# Patient Record
Sex: Female | Born: 1960 | Race: Black or African American | Hispanic: No | Smoking: Current every day smoker
Health system: Southern US, Community
[De-identification: ages and names within clinical notes are randomized; demographics above are authoritative.]

## PROBLEM LIST (undated history)

## (undated) DIAGNOSIS — F329 Major depressive disorder, single episode, unspecified: Secondary | ICD-10-CM

## (undated) DIAGNOSIS — K219 Gastro-esophageal reflux disease without esophagitis: Secondary | ICD-10-CM

## (undated) DIAGNOSIS — M5030 Other cervical disc degeneration, unspecified cervical region: Secondary | ICD-10-CM

## (undated) DIAGNOSIS — G629 Polyneuropathy, unspecified: Secondary | ICD-10-CM

## (undated) DIAGNOSIS — F32A Depression, unspecified: Secondary | ICD-10-CM

## (undated) DIAGNOSIS — F319 Bipolar disorder, unspecified: Secondary | ICD-10-CM

## (undated) DIAGNOSIS — R350 Frequency of micturition: Secondary | ICD-10-CM

## (undated) DIAGNOSIS — Z9221 Personal history of antineoplastic chemotherapy: Secondary | ICD-10-CM

## (undated) DIAGNOSIS — Z923 Personal history of irradiation: Secondary | ICD-10-CM

## (undated) DIAGNOSIS — C50919 Malignant neoplasm of unspecified site of unspecified female breast: Secondary | ICD-10-CM

## (undated) DIAGNOSIS — N2889 Other specified disorders of kidney and ureter: Secondary | ICD-10-CM

## (undated) DIAGNOSIS — I10 Essential (primary) hypertension: Secondary | ICD-10-CM

## (undated) HISTORY — DX: Bipolar disorder, unspecified: F31.9

## (undated) HISTORY — DX: Essential (primary) hypertension: I10

## (undated) HISTORY — DX: Other cervical disc degeneration, unspecified cervical region: M50.30

## (undated) HISTORY — DX: Malignant neoplasm of unspecified site of unspecified female breast: C50.919

## (undated) HISTORY — DX: Polyneuropathy, unspecified: G62.9

## (undated) HISTORY — PX: WRIST SURGERY: SHX841

## (undated) HISTORY — DX: Depression, unspecified: F32.A

## (undated) HISTORY — DX: Other specified disorders of kidney and ureter: N28.89

## (undated) HISTORY — PX: PARTIAL HYSTERECTOMY: SHX80

## (undated) HISTORY — PX: TOE SURGERY: SHX1073

## (undated) HISTORY — DX: Major depressive disorder, single episode, unspecified: F32.9

## (undated) HISTORY — DX: Frequency of micturition: R35.0

## (undated) HISTORY — PX: COLONOSCOPY: SHX174

---

## 1990-10-17 HISTORY — PX: ABDOMINAL HYSTERECTOMY: SHX81

## 2004-10-17 DIAGNOSIS — C50919 Malignant neoplasm of unspecified site of unspecified female breast: Secondary | ICD-10-CM

## 2004-10-17 HISTORY — PX: BREAST BIOPSY: SHX20

## 2004-10-17 HISTORY — DX: Malignant neoplasm of unspecified site of unspecified female breast: C50.919

## 2004-10-17 HISTORY — PX: BREAST LUMPECTOMY: SHX2

## 2005-08-17 DIAGNOSIS — C50919 Malignant neoplasm of unspecified site of unspecified female breast: Secondary | ICD-10-CM | POA: Insufficient documentation

## 2005-09-07 ENCOUNTER — Ambulatory Visit: Payer: Self-pay | Admitting: Family Medicine

## 2005-09-27 ENCOUNTER — Ambulatory Visit: Payer: Self-pay | Admitting: General Surgery

## 2005-10-12 ENCOUNTER — Ambulatory Visit: Payer: Self-pay | Admitting: Radiation Oncology

## 2005-10-17 ENCOUNTER — Ambulatory Visit: Payer: Self-pay | Admitting: Radiation Oncology

## 2005-10-25 ENCOUNTER — Ambulatory Visit: Payer: Self-pay | Admitting: General Surgery

## 2005-11-17 ENCOUNTER — Ambulatory Visit: Payer: Self-pay | Admitting: Radiation Oncology

## 2005-12-15 ENCOUNTER — Ambulatory Visit: Payer: Self-pay | Admitting: Radiation Oncology

## 2006-01-15 ENCOUNTER — Ambulatory Visit: Payer: Self-pay | Admitting: Radiation Oncology

## 2006-02-14 ENCOUNTER — Ambulatory Visit: Payer: Self-pay | Admitting: Radiation Oncology

## 2006-03-17 ENCOUNTER — Ambulatory Visit: Payer: Self-pay | Admitting: Radiation Oncology

## 2006-04-16 ENCOUNTER — Ambulatory Visit: Payer: Self-pay | Admitting: Radiation Oncology

## 2006-05-17 ENCOUNTER — Ambulatory Visit: Payer: Self-pay | Admitting: Radiation Oncology

## 2006-08-11 ENCOUNTER — Ambulatory Visit: Payer: Self-pay | Admitting: Internal Medicine

## 2006-08-17 ENCOUNTER — Ambulatory Visit: Payer: Self-pay | Admitting: Internal Medicine

## 2006-08-23 ENCOUNTER — Ambulatory Visit: Payer: Self-pay | Admitting: General Surgery

## 2006-11-24 ENCOUNTER — Ambulatory Visit: Payer: Self-pay | Admitting: Internal Medicine

## 2006-12-16 ENCOUNTER — Ambulatory Visit: Payer: Self-pay | Admitting: Internal Medicine

## 2007-02-14 ENCOUNTER — Ambulatory Visit: Payer: Self-pay | Admitting: Radiation Oncology

## 2007-02-15 ENCOUNTER — Ambulatory Visit: Payer: Self-pay | Admitting: Radiation Oncology

## 2007-02-23 ENCOUNTER — Ambulatory Visit: Payer: Self-pay | Admitting: Internal Medicine

## 2007-03-18 ENCOUNTER — Ambulatory Visit: Payer: Self-pay | Admitting: Radiation Oncology

## 2007-03-18 ENCOUNTER — Ambulatory Visit: Payer: Self-pay | Admitting: Internal Medicine

## 2007-06-18 ENCOUNTER — Ambulatory Visit: Payer: Self-pay | Admitting: Internal Medicine

## 2007-06-26 ENCOUNTER — Ambulatory Visit: Payer: Self-pay | Admitting: Internal Medicine

## 2007-06-29 IMAGING — NM NM SENTINAL NODE INJECTION (BREAST) - NO REPORT
1 series · 6 of 6 positions shown · non-contrast
Comparison: none

REASON FOR EXAM: breast CA right surgery at 11am
COMMENTS:

[Series 1: sent node · 2.40mm/px · 3 acquisitions, 6 frames shown]
[im 1/3]
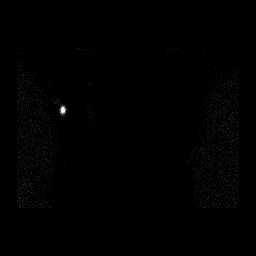
[im 1/3]
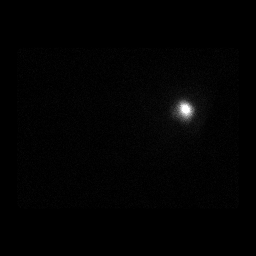
[im 2/3]
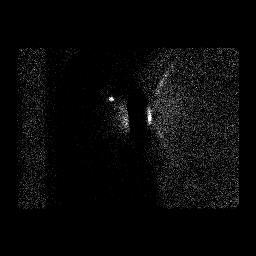
[im 2/3]
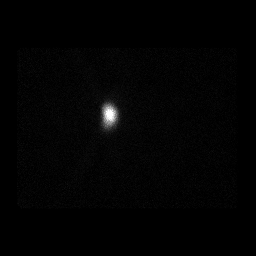
[im 3/3]
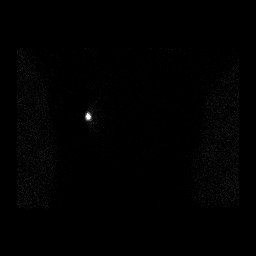
[im 3/3]
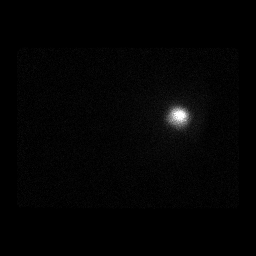

[6 of 6 positions shown; findings below may reference images not displayed]

PROCEDURE:     NM  - NM SENTINEL NODE  BREAST  - September 27, 2005  [DATE]

RESULT:

PROCEDURE AND FINDINGS: Following sterile preparation of the patient, local
anesthesia was administered with 1% lidocaine; 1.15 mCi of technetium-11m
sulfur colloid was administered in the periareolar region for sentinel node
imaging.
IMPRESSION: 1)Successful injection for sentinel node study for RIGHT breast cancer.

## 2007-07-18 ENCOUNTER — Ambulatory Visit: Payer: Self-pay | Admitting: Internal Medicine

## 2007-08-27 ENCOUNTER — Ambulatory Visit: Payer: Self-pay | Admitting: Internal Medicine

## 2007-10-18 ENCOUNTER — Ambulatory Visit: Payer: Self-pay | Admitting: Internal Medicine

## 2007-10-30 ENCOUNTER — Ambulatory Visit: Payer: Self-pay | Admitting: Internal Medicine

## 2007-11-18 ENCOUNTER — Ambulatory Visit: Payer: Self-pay | Admitting: Internal Medicine

## 2007-12-16 ENCOUNTER — Ambulatory Visit: Payer: Self-pay | Admitting: Internal Medicine

## 2008-01-08 ENCOUNTER — Inpatient Hospital Stay: Payer: Self-pay | Admitting: Unknown Physician Specialty

## 2008-02-15 ENCOUNTER — Ambulatory Visit: Payer: Self-pay | Admitting: Internal Medicine

## 2008-02-27 ENCOUNTER — Ambulatory Visit: Payer: Self-pay | Admitting: Internal Medicine

## 2008-03-17 ENCOUNTER — Ambulatory Visit: Payer: Self-pay | Admitting: Internal Medicine

## 2008-06-03 ENCOUNTER — Other Ambulatory Visit: Payer: Self-pay

## 2008-06-03 ENCOUNTER — Inpatient Hospital Stay: Payer: Self-pay | Admitting: Psychiatry

## 2008-06-17 ENCOUNTER — Ambulatory Visit: Payer: Self-pay | Admitting: Internal Medicine

## 2008-06-30 ENCOUNTER — Ambulatory Visit: Payer: Self-pay | Admitting: Internal Medicine

## 2008-07-17 ENCOUNTER — Ambulatory Visit: Payer: Self-pay | Admitting: Internal Medicine

## 2008-09-15 ENCOUNTER — Ambulatory Visit: Payer: Self-pay | Admitting: General Surgery

## 2008-10-17 ENCOUNTER — Ambulatory Visit: Payer: Self-pay | Admitting: Internal Medicine

## 2008-11-05 ENCOUNTER — Ambulatory Visit: Payer: Self-pay | Admitting: Internal Medicine

## 2008-11-17 ENCOUNTER — Ambulatory Visit: Payer: Self-pay | Admitting: Internal Medicine

## 2008-12-15 ENCOUNTER — Ambulatory Visit: Payer: Self-pay | Admitting: Internal Medicine

## 2009-02-14 ENCOUNTER — Ambulatory Visit: Payer: Self-pay | Admitting: Internal Medicine

## 2009-03-10 ENCOUNTER — Ambulatory Visit: Payer: Self-pay | Admitting: Internal Medicine

## 2009-03-10 ENCOUNTER — Encounter: Payer: Self-pay | Admitting: Gastroenterology

## 2009-03-13 ENCOUNTER — Ambulatory Visit: Payer: Self-pay | Admitting: Internal Medicine

## 2009-03-13 ENCOUNTER — Encounter: Payer: Self-pay | Admitting: Gastroenterology

## 2009-03-17 ENCOUNTER — Ambulatory Visit: Payer: Self-pay | Admitting: Internal Medicine

## 2009-03-24 ENCOUNTER — Encounter: Payer: Self-pay | Admitting: Gastroenterology

## 2009-03-26 ENCOUNTER — Encounter: Payer: Self-pay | Admitting: Gastroenterology

## 2009-03-30 ENCOUNTER — Telehealth (INDEPENDENT_AMBULATORY_CARE_PROVIDER_SITE_OTHER): Payer: Self-pay | Admitting: *Deleted

## 2009-03-30 ENCOUNTER — Encounter: Payer: Self-pay | Admitting: Gastroenterology

## 2009-03-30 DIAGNOSIS — R932 Abnormal findings on diagnostic imaging of liver and biliary tract: Secondary | ICD-10-CM

## 2009-03-30 DIAGNOSIS — R634 Abnormal weight loss: Secondary | ICD-10-CM

## 2009-04-02 ENCOUNTER — Ambulatory Visit (HOSPITAL_COMMUNITY): Admission: RE | Admit: 2009-04-02 | Discharge: 2009-04-02 | Payer: Self-pay | Admitting: Gastroenterology

## 2009-04-02 ENCOUNTER — Encounter: Payer: Self-pay | Admitting: Gastroenterology

## 2009-04-02 ENCOUNTER — Ambulatory Visit: Payer: Self-pay | Admitting: Gastroenterology

## 2009-04-16 ENCOUNTER — Ambulatory Visit: Payer: Self-pay | Admitting: Internal Medicine

## 2009-05-17 ENCOUNTER — Ambulatory Visit: Payer: Self-pay | Admitting: Internal Medicine

## 2009-06-01 ENCOUNTER — Ambulatory Visit: Payer: Self-pay | Admitting: Internal Medicine

## 2009-06-17 ENCOUNTER — Ambulatory Visit: Payer: Self-pay | Admitting: Internal Medicine

## 2009-07-17 ENCOUNTER — Ambulatory Visit: Payer: Self-pay | Admitting: Internal Medicine

## 2009-09-16 ENCOUNTER — Ambulatory Visit: Payer: Self-pay | Admitting: Internal Medicine

## 2009-09-28 ENCOUNTER — Ambulatory Visit: Payer: Self-pay | Admitting: Internal Medicine

## 2009-10-17 ENCOUNTER — Ambulatory Visit: Payer: Self-pay | Admitting: Internal Medicine

## 2009-10-17 HISTORY — PX: CERVICAL SPINE SURGERY: SHX589

## 2009-11-17 ENCOUNTER — Ambulatory Visit: Payer: Self-pay | Admitting: Internal Medicine

## 2009-12-09 ENCOUNTER — Ambulatory Visit: Payer: Self-pay | Admitting: Gastroenterology

## 2010-04-16 ENCOUNTER — Ambulatory Visit: Payer: Self-pay | Admitting: Internal Medicine

## 2010-04-20 ENCOUNTER — Ambulatory Visit: Payer: Self-pay | Admitting: Internal Medicine

## 2010-05-17 ENCOUNTER — Ambulatory Visit: Payer: Self-pay | Admitting: Internal Medicine

## 2010-05-27 ENCOUNTER — Encounter: Payer: Self-pay | Admitting: Orthopedic Surgery

## 2010-06-17 ENCOUNTER — Encounter: Payer: Self-pay | Admitting: Orthopedic Surgery

## 2010-07-17 ENCOUNTER — Encounter: Payer: Self-pay | Admitting: Orthopedic Surgery

## 2010-08-17 ENCOUNTER — Encounter: Payer: Self-pay | Admitting: Orthopedic Surgery

## 2010-09-16 ENCOUNTER — Encounter: Payer: Self-pay | Admitting: Orthopedic Surgery

## 2011-08-04 ENCOUNTER — Ambulatory Visit: Payer: Self-pay | Admitting: Family Medicine

## 2012-11-08 ENCOUNTER — Ambulatory Visit: Payer: Self-pay | Admitting: Family Medicine

## 2014-08-29 DIAGNOSIS — M5030 Other cervical disc degeneration, unspecified cervical region: Secondary | ICD-10-CM | POA: Insufficient documentation

## 2014-09-02 ENCOUNTER — Ambulatory Visit: Payer: Self-pay | Admitting: Internal Medicine

## 2014-10-08 ENCOUNTER — Ambulatory Visit: Payer: Self-pay | Admitting: Internal Medicine

## 2014-10-23 DIAGNOSIS — I10 Essential (primary) hypertension: Secondary | ICD-10-CM | POA: Diagnosis not present

## 2014-10-23 DIAGNOSIS — N390 Urinary tract infection, site not specified: Secondary | ICD-10-CM | POA: Diagnosis not present

## 2014-10-23 DIAGNOSIS — N2889 Other specified disorders of kidney and ureter: Secondary | ICD-10-CM | POA: Diagnosis not present

## 2014-10-28 DIAGNOSIS — R350 Frequency of micturition: Secondary | ICD-10-CM | POA: Diagnosis not present

## 2014-10-28 DIAGNOSIS — N2889 Other specified disorders of kidney and ureter: Secondary | ICD-10-CM | POA: Diagnosis not present

## 2014-10-28 DIAGNOSIS — R8290 Unspecified abnormal findings in urine: Secondary | ICD-10-CM | POA: Diagnosis not present

## 2014-10-28 DIAGNOSIS — R319 Hematuria, unspecified: Secondary | ICD-10-CM | POA: Diagnosis not present

## 2014-11-07 ENCOUNTER — Ambulatory Visit: Payer: Self-pay

## 2014-11-07 DIAGNOSIS — N289 Disorder of kidney and ureter, unspecified: Secondary | ICD-10-CM | POA: Diagnosis not present

## 2014-11-07 DIAGNOSIS — N2889 Other specified disorders of kidney and ureter: Secondary | ICD-10-CM | POA: Diagnosis not present

## 2014-11-14 DIAGNOSIS — R8299 Other abnormal findings in urine: Secondary | ICD-10-CM | POA: Diagnosis not present

## 2014-11-14 DIAGNOSIS — N2889 Other specified disorders of kidney and ureter: Secondary | ICD-10-CM | POA: Diagnosis not present

## 2014-11-14 DIAGNOSIS — R197 Diarrhea, unspecified: Secondary | ICD-10-CM | POA: Diagnosis not present

## 2015-04-06 ENCOUNTER — Other Ambulatory Visit: Payer: Self-pay | Admitting: Family Medicine

## 2015-04-06 DIAGNOSIS — N2889 Other specified disorders of kidney and ureter: Secondary | ICD-10-CM

## 2015-05-25 ENCOUNTER — Ambulatory Visit (INDEPENDENT_AMBULATORY_CARE_PROVIDER_SITE_OTHER): Payer: Commercial Managed Care - HMO | Admitting: Urology

## 2015-05-25 VITALS — BP 138/86 | HR 77 | Ht 66.0 in | Wt 148.7 lb

## 2015-05-25 DIAGNOSIS — R310 Gross hematuria: Secondary | ICD-10-CM | POA: Diagnosis not present

## 2015-05-25 DIAGNOSIS — G629 Polyneuropathy, unspecified: Secondary | ICD-10-CM | POA: Insufficient documentation

## 2015-05-25 DIAGNOSIS — N2889 Other specified disorders of kidney and ureter: Secondary | ICD-10-CM

## 2015-05-25 DIAGNOSIS — I10 Essential (primary) hypertension: Secondary | ICD-10-CM | POA: Insufficient documentation

## 2015-05-25 DIAGNOSIS — Z8659 Personal history of other mental and behavioral disorders: Secondary | ICD-10-CM | POA: Insufficient documentation

## 2015-05-25 DIAGNOSIS — R312 Other microscopic hematuria: Secondary | ICD-10-CM | POA: Diagnosis not present

## 2015-05-25 DIAGNOSIS — F32A Depression, unspecified: Secondary | ICD-10-CM | POA: Insufficient documentation

## 2015-05-25 DIAGNOSIS — F329 Major depressive disorder, single episode, unspecified: Secondary | ICD-10-CM | POA: Insufficient documentation

## 2015-05-25 LAB — URINALYSIS, COMPLETE
Bilirubin, UA: NEGATIVE
Glucose, UA: NEGATIVE
Nitrite, UA: NEGATIVE
PH UA: 6 (ref 5.0–7.5)
PROTEIN UA: NEGATIVE
SPEC GRAV UA: 1.025 (ref 1.005–1.030)
UUROB: 1 mg/dL (ref 0.2–1.0)

## 2015-05-25 LAB — MICROSCOPIC EXAMINATION

## 2015-05-25 NOTE — Progress Notes (Signed)
05/25/2015 10:42 AM   Erica Stone 23-Oct-1960 [id number]  Referring provider: Idelle Crouch, MD [place], [place] [address]  Chief Complaint  Patient presents with  . Renal Mass    70month follow up    HPI: Erica Stone returns today in f/u for an 44mm indeterminant lesion seen in the left kidney in 1/16.  She has a new complaint of daily hematuria over the last 4-5 months.   She sees blood on the tissue when she wipes after voiding.  She can see some blood in the toilet as well.  It is occuring primarily early in the month and is generally early in the day.  She is perimenopausal but has not had not a period in 20 years but has had a partial hysterectomy.   She has no dysuria.  She has had some left flank catching that is like a big air bubble but she can get relief with positional changes.  She has not had imaging prior to this visit.      PMH: Past Medical History  Diagnosis Date  . Neuropathy   . Bipolar disorder   . DDD (degenerative disc disease), cervical   . Lobular carcinoma of breast   . Depression   . Urinary frequency   . Renal mass   . Hypertension     Surgical History: Past Surgical History  Procedure Laterality Date  . Partial hysterectomy    . Cervical spine surgery    . Breast lumpectomy      sentinel node biopsy  . Wrist surgery Left     ganglion with reoccurence  . Toe surgery Left     4th & 5th    Home Medications:    Medication List       This list is accurate as of: 05/25/15 10:42 AM.  Always use your most recent med list.               Calcium Citrate-Vitamin D3 1000-400 Liqd  Take by mouth.     cyclobenzaprine 10 MG tablet  Commonly known as:  FLEXERIL  Take by mouth.     D 2000 2000 UNITS Tabs  Generic drug:  Cholecalciferol  Take by mouth.     DAILY COMBO MULTIVITS/CALCIUM PO  Take by mouth.     gabapentin 600 MG tablet  Commonly known as:  NEURONTIN  Take by mouth.     triamterene-hydrochlorothiazide 37.5-25 MG per tablet  Commonly known as:  MAXZIDE-25  TAKE ONE (1) TABLET EACH DAY     vitamin E 1000 UNIT capsule  Take by mouth.        Allergies:  Allergies  Allergen Reactions  . Other     Other reaction(s): Other (See Comments) Gotalonium IV dye- hot with vomiting    Family History: Family History  Problem Relation Age of Onset  . Prostate cancer Father   . Prostate cancer Brother     Social History:  reports that she has been smoking Cigarettes.  She has a 18 pack-year smoking history. She does not have any smokeless tobacco history on file. She reports that she drinks alcohol. She reports that she does not use illicit drugs.  ROS: UROLOGY Frequent Urination?: Yes Hard to postpone urination?: No Burning/pain with urination?: No Get up at night to urinate?: Yes Leakage of urine?: No Urine stream starts and stops?: Yes Trouble starting stream?: Yes Do you have to strain to urinate?: No Blood in urine?: Yes Urinary  tract infection?: No Sexually transmitted disease?: No Injury to kidneys or bladder?: No Painful intercourse?: No Weak stream?: No Currently pregnant?: No Vaginal bleeding?: Yes Last menstrual period?: hysterectomy  Gastrointestinal Nausea?: Yes Vomiting?: No Indigestion/heartburn?: Yes Diarrhea?: Yes Constipation?: No  Constitutional Fever: No Night sweats?: Yes Weight loss?: Yes Fatigue?: Yes  Skin Skin rash/lesions?: No Itching?: No  Eyes Blurred vision?: No Double vision?: No  Ears/Nose/Throat Sore throat?: No Sinus problems?: Yes  Hematologic/Lymphatic Swollen glands?: No Easy bruising?: Yes  Cardiovascular Leg swelling?: No Chest pain?: Yes  Respiratory Cough?: Yes Shortness of breath?: Yes  Endocrine Excessive thirst?: No  Musculoskeletal Back pain?: No Joint pain?: Yes  Neurological Headaches?: Yes Dizziness?: No  Psychologic Depression?: No Anxiety?: No  Physical  Exam: BP 138/86 mmHg  Pulse 77  Ht 5\' 6"  (1.676 m)  Wt 148 lb 11.2 oz (67.45 kg)  BMI 24.01 kg/m2  Physical Exam  Constitutional: She appears well-developed and well-nourished.  Abdominal: Soft. She exhibits no distension (but she is somewhat bloated but soft.) and no mass. There is no tenderness (LUQ ). No hernia.  Genitourinary:  She has some vaginal atrophy with a white discharge.  The meatus is normal.  She has good support.   The bladder is non-tender.  No pelvic masses are noted.   Vitals reviewed.    Laboratory Data: No results found for: WBC, HGB, HCT, MCV, PLT  No results found for: CREATININE  No results found for: PSA  No results found for: TESTOSTERONE  No results found for: HGBA1C  Urinalysis UA has 3-10 WBC, 3-10 RBC and few bacteria.  Pertinent Imaging: Prior MRI report reviewed.  Assessment & Plan:    Problem List Items Addressed This Visit    Left renal mass    She has left flank pain that is probably unrelated to the small mass, but she needs f/u imaging and had intolerance to gadolinium.  I will get a CT for the mass and hematuria.  F/U with results.      Gross hematuria - Primary    She has had intermittent hematuria.  I am going to get a CT AP w/wo and have her return for cystoscopy      Relevant Orders   Urinalysis, Complete   CT Abdomen Pelvis W Wo Contrast   Basic metabolic panel      Return in about 4 weeks (around 06/22/2015) for CT results and cystoscopy.  Erica So, MD  [facility] [address] [place], [place] [address] [phone]

## 2015-05-25 NOTE — Progress Notes (Deleted)
05/25/2015 9:52 AM   [name] [birth year] [id number]  Referring provider: Idelle Crouch, MD [place], [place] [address]  Chief Complaint  Patient presents with  . Renal Mass    46month follow up    HPI: ***     PMH: Past Medical History  Diagnosis Date  . Neuropathy   . Bipolar disorder   . DDD (degenerative disc disease), cervical   . Lobular carcinoma of breast   . Depression   . Urinary frequency   . Renal mass   . Hypertension     Surgical History: Past Surgical History  Procedure Laterality Date  . Partial hysterectomy    . Cervical spine surgery    . Breast lumpectomy      sentinel node biopsy  . Wrist surgery Left     ganglion with reoccurence  . Toe surgery Left     4th & 5th    Home Medications:    Medication List       This list is accurate as of: 05/25/15  9:52 AM.  Always use your most recent med list.               Calcium Citrate-Vitamin D3 1000-400 Liqd  Take by mouth.     cyclobenzaprine 10 MG tablet  Commonly known as:  FLEXERIL  Take by mouth.     D 2000 2000 UNITS Tabs  Generic drug:  Cholecalciferol  Take by mouth.     DAILY COMBO MULTIVITS/CALCIUM PO  Take by mouth.     gabapentin 600 MG tablet  Commonly known as:  NEURONTIN  Take by mouth.     triamterene-hydrochlorothiazide 37.5-25 MG per tablet  Commonly known as:  MAXZIDE-25  TAKE ONE (1) TABLET EACH DAY     vitamin E 1000 UNIT capsule  Take by mouth.        Allergies:  Allergies  Allergen Reactions  . Other     Other reaction(s): Other (See Comments) Gotalonium IV dye- hot with vomiting    Family History: Family History  Problem Relation Age of Onset  . Prostate cancer Father   . Prostate cancer Brother     Social History:  reports that she has been smoking Cigarettes.  She has a 18 pack-year smoking history. She does not have any smokeless tobacco history on file. She reports that she drinks alcohol. She reports that  she does not use illicit drugs.  ROS:                                        Physical Exam: BP 138/86 mmHg  Pulse 77  Ht 5\' 6"  (1.676 m)  Wt 148 lb 11.2 oz (67.45 kg)  BMI 24.01 kg/m2  Constitutional:  Alert and oriented, No acute distress. HEENT:  AT, moist mucus membranes.  Trachea midline, no masses. Cardiovascular: No clubbing, cyanosis, or edema. Respiratory: Normal respiratory effort, no increased work of breathing. GI: Abdomen is soft, nontender, nondistended, no abdominal masses GU: No CVA tenderness. *** Skin: No rashes, bruises or suspicious lesions. Lymph: No cervical or inguinal adenopathy. Neurologic: Grossly intact, no focal deficits, moving all 4 extremities. Psychiatric: Normal mood and affect.  Laboratory Data: No results found for: WBC, HGB, HCT, MCV, PLT  No results found for: CREATININE  No results found for: PSA  No results found for: TESTOSTERONE  No results found  for: HGBA1C  Urinalysis No results found for: COLORURINE, APPEARANCEUR, LABSPEC, PHURINE, GLUCOSEU, HGBUR, BILIRUBINUR, KETONESUR, PROTEINUR, UROBILINOGEN, NITRITE, LEUKOCYTESUR  Pertinent Imaging: ***  Assessment & Plan:  ***  1. Gross hematuria *** - Urinalysis, Complete   No Follow-up on file.  Malka So, MD  [facility] [address] [place], [place] [address] [phone]

## 2015-05-25 NOTE — Assessment & Plan Note (Signed)
She has had intermittent hematuria.  I am going to get a CT AP w/wo and have her return for cystoscopy

## 2015-05-25 NOTE — Assessment & Plan Note (Signed)
She has left flank pain that is probably unrelated to the small mass, but she needs f/u imaging and had intolerance to gadolinium.  I will get a CT for the mass and hematuria.  F/U with results.

## 2015-05-26 LAB — BASIC METABOLIC PANEL
BUN / CREAT RATIO: 13 (ref 9–23)
BUN: 11 mg/dL (ref 6–24)
CHLORIDE: 97 mmol/L (ref 97–108)
CO2: 22 mmol/L (ref 18–29)
Calcium: 9.3 mg/dL (ref 8.7–10.2)
Creatinine, Ser: 0.83 mg/dL (ref 0.57–1.00)
GFR calc Af Amer: 92 mL/min/{1.73_m2} (ref >59)
GFR calc non Af Amer: 80 mL/min/{1.73_m2} (ref >59)
Glucose: 104 mg/dL — ABNORMAL HIGH (ref 65–99)
Potassium: 4 mmol/L (ref 3.5–5.2)
Sodium: 138 mmol/L (ref 134–144)

## 2015-06-02 ENCOUNTER — Ambulatory Visit
Admission: RE | Admit: 2015-06-02 | Discharge: 2015-06-02 | Disposition: A | Discharge status: Home / Self Care | Payer: Commercial Managed Care - HMO | Source: Ambulatory Visit | Attending: Urology | Admitting: Urology

## 2015-06-02 DIAGNOSIS — N2889 Other specified disorders of kidney and ureter: Secondary | ICD-10-CM | POA: Diagnosis not present

## 2015-06-02 DIAGNOSIS — K5730 Diverticulosis of large intestine without perforation or abscess without bleeding: Secondary | ICD-10-CM | POA: Diagnosis not present

## 2015-06-02 DIAGNOSIS — Z853 Personal history of malignant neoplasm of breast: Secondary | ICD-10-CM | POA: Diagnosis not present

## 2015-06-02 DIAGNOSIS — R310 Gross hematuria: Secondary | ICD-10-CM | POA: Insufficient documentation

## 2015-06-02 DIAGNOSIS — N289 Disorder of kidney and ureter, unspecified: Secondary | ICD-10-CM | POA: Diagnosis not present

## 2015-06-02 MED ORDER — IOHEXOL 300 MG/ML  SOLN
125.0000 mL | Freq: Once | INTRAMUSCULAR | Status: AC | PRN
  Administered 2015-06-02: 125 mL via INTRAVENOUS

## 2015-06-17 ENCOUNTER — Other Ambulatory Visit: Payer: Self-pay

## 2015-06-25 ENCOUNTER — Ambulatory Visit (INDEPENDENT_AMBULATORY_CARE_PROVIDER_SITE_OTHER): Payer: Commercial Managed Care - HMO | Admitting: Urology

## 2015-06-25 VITALS — BP 135/77 | HR 102 | Ht 66.0 in | Wt 149.0 lb

## 2015-06-25 DIAGNOSIS — N2889 Other specified disorders of kidney and ureter: Secondary | ICD-10-CM | POA: Diagnosis not present

## 2015-06-25 DIAGNOSIS — R310 Gross hematuria: Secondary | ICD-10-CM

## 2015-06-25 LAB — URINALYSIS, COMPLETE
BILIRUBIN UA: NEGATIVE
GLUCOSE, UA: NEGATIVE
KETONES UA: NEGATIVE
Nitrite, UA: NEGATIVE
Protein, UA: NEGATIVE
SPEC GRAV UA: 1.02 (ref 1.005–1.030)
Urobilinogen, Ur: 0.2 mg/dL (ref 0.2–1.0)
pH, UA: 6.5 (ref 5.0–7.5)

## 2015-06-25 LAB — MICROSCOPIC EXAMINATION
BACTERIA UA: NONE SEEN
WBC UA: NONE SEEN /HPF (ref 0–?)

## 2015-06-25 MED ORDER — CIPROFLOXACIN HCL 500 MG PO TABS
500.0000 mg | ORAL_TABLET | Freq: Once | ORAL | Status: AC
  Administered 2015-06-25: 500 mg via ORAL

## 2015-06-25 MED ORDER — LIDOCAINE HCL 2 % EX GEL
1.0000 "application " | Freq: Once | CUTANEOUS | Status: AC
  Administered 2015-06-25: 1 via URETHRAL

## 2015-06-25 NOTE — Progress Notes (Signed)
    CC: Hematuria, Renal mass  HPI: Erica Stone returns today for follow-up for hematuria. She has a history of an 11 mm indeterminant lesion seen in the left kidney in January 2016. Repeat imaging from August 2016 shows no change in this indeterminant 11 mm left renal lesion. Her CT scan is urogram was otherwise unremarkable. She presents today for completion of her hematuria workup with cystoscopy.   Cystoscopy Procedure Note  Patient identification was confirmed, informed consent was obtained, and patient was prepped using Betadine solution.  Lidocaine jelly was administered per urethral meatus.    Preoperative abx where received prior to procedure.    Procedure: - Flexible cystoscope introduced, without any difficulty.   - Thorough search of the bladder revealed:    normal urethral meatus    normal urothelium    no stones    no ulcers     no tumors    no urethral polyps    no trabeculation  - Ureteral orifices were normal in position and appearance.  Post-Procedure: - Patient tolerated the procedure well    A/P: 1. Hematuria Cystoscopy normal. Hematuria workup negative. Repeat urinalysis in one year.  2.  Left renal mass The patient has a 11 mm left indeterminant renal mass seen on imaging in January and then August 2016 with no interval change. She does not tolerate gadolinium contrast for an MRI. As the mass is stable, we will continue surveillance CT scan at six months.  If it remains stable that time, we can increase the interval between CT scans.  Follow up: 6 months

## 2015-11-23 DIAGNOSIS — I10 Essential (primary) hypertension: Secondary | ICD-10-CM | POA: Diagnosis not present

## 2015-11-23 DIAGNOSIS — Z1329 Encounter for screening for other suspected endocrine disorder: Secondary | ICD-10-CM | POA: Diagnosis not present

## 2015-11-23 DIAGNOSIS — Z79899 Other long term (current) drug therapy: Secondary | ICD-10-CM | POA: Diagnosis not present

## 2015-11-23 DIAGNOSIS — H539 Unspecified visual disturbance: Secondary | ICD-10-CM | POA: Diagnosis not present

## 2015-11-23 DIAGNOSIS — G629 Polyneuropathy, unspecified: Secondary | ICD-10-CM | POA: Diagnosis not present

## 2015-11-23 DIAGNOSIS — R7309 Other abnormal glucose: Secondary | ICD-10-CM | POA: Diagnosis not present

## 2015-11-23 DIAGNOSIS — F3341 Major depressive disorder, recurrent, in partial remission: Secondary | ICD-10-CM | POA: Diagnosis not present

## 2015-11-23 DIAGNOSIS — Z1239 Encounter for other screening for malignant neoplasm of breast: Secondary | ICD-10-CM | POA: Diagnosis not present

## 2015-11-23 DIAGNOSIS — M5030 Other cervical disc degeneration, unspecified cervical region: Secondary | ICD-10-CM | POA: Diagnosis not present

## 2015-11-24 ENCOUNTER — Encounter: Payer: Self-pay | Admitting: Obstetrics and Gynecology

## 2015-11-24 ENCOUNTER — Ambulatory Visit (INDEPENDENT_AMBULATORY_CARE_PROVIDER_SITE_OTHER): Payer: Commercial Managed Care - HMO | Admitting: Obstetrics and Gynecology

## 2015-11-24 VITALS — BP 132/82 | HR 86 | Ht 66.0 in | Wt 154.8 lb

## 2015-11-24 DIAGNOSIS — N939 Abnormal uterine and vaginal bleeding, unspecified: Secondary | ICD-10-CM | POA: Diagnosis not present

## 2015-11-24 DIAGNOSIS — N952 Postmenopausal atrophic vaginitis: Secondary | ICD-10-CM

## 2015-11-24 DIAGNOSIS — R319 Hematuria, unspecified: Secondary | ICD-10-CM

## 2015-11-24 DIAGNOSIS — Z90710 Acquired absence of both cervix and uterus: Secondary | ICD-10-CM | POA: Diagnosis not present

## 2015-11-24 DIAGNOSIS — N898 Other specified noninflammatory disorders of vagina: Secondary | ICD-10-CM

## 2015-11-24 DIAGNOSIS — N950 Postmenopausal bleeding: Secondary | ICD-10-CM | POA: Diagnosis not present

## 2015-11-24 DIAGNOSIS — N761 Subacute and chronic vaginitis: Secondary | ICD-10-CM | POA: Diagnosis not present

## 2015-11-24 DIAGNOSIS — B977 Papillomavirus as the cause of diseases classified elsewhere: Secondary | ICD-10-CM | POA: Diagnosis not present

## 2015-11-24 LAB — POCT URINALYSIS DIPSTICK
BILIRUBIN UA: NEGATIVE
GLUCOSE UA: NEGATIVE
Ketones, UA: NEGATIVE
Leukocytes, UA: NEGATIVE
Nitrite, UA: NEGATIVE
Protein, UA: NEGATIVE
SPEC GRAV UA: 1.01
Urobilinogen, UA: 0.2
pH, UA: 7.5

## 2015-11-24 MED ORDER — CLONIDINE HCL 0.1 MG/24HR TD PTWK: 0.1000 mg | MEDICATED_PATCH | TRANSDERMAL | Status: DC

## 2015-11-24 NOTE — Patient Instructions (Addendum)
1.  Vaginal biopsies are done today. 2.  Urinary catheterization is done today.  Urine is sent for culture. 3.  Return in 10 days for follow-up 4.  Applied the clonidine patch Once a week to help with hot flashes.

## 2015-11-25 LAB — URINE CULTURE: Organism ID, Bacteria: NO GROWTH

## 2015-11-26 LAB — PATHOLOGY

## 2015-11-30 DIAGNOSIS — Z90710 Acquired absence of both cervix and uterus: Secondary | ICD-10-CM | POA: Insufficient documentation

## 2015-11-30 DIAGNOSIS — N939 Abnormal uterine and vaginal bleeding, unspecified: Secondary | ICD-10-CM | POA: Insufficient documentation

## 2015-11-30 DIAGNOSIS — N952 Postmenopausal atrophic vaginitis: Secondary | ICD-10-CM | POA: Insufficient documentation

## 2015-11-30 NOTE — Progress Notes (Signed)
Chief complaint: 1.  Vaginal bleeding.  Patient is a 55 year old after married female, status post hysterectomy for uterine fibroids in 1992, menopausal, with vasomotor symptoms, not on hormone replacement therapy, with history of breast cancer, status post lumpectomy, chemotherapy 10 years ago, currently NED, Presents for evaluation of intermittent vaginal bleeding. Patient reports the bleeding to occur irregularly.  It appears to be coming from the urethra. Patient did have urology workup for dysuria, including cystoscopy.  2 months ago which was negative for abnormality.  CT scan ruled out significant upper tract disease. There is family history of colon cancer in maternal grandmother.  There is family history of prostate cancer in father, as well as renal disease.  Past Medical History  Diagnosis Date  . Neuropathy ([place])   . Bipolar disorder ([place])   . DDD (degenerative disc disease), cervical   . Depression   . Urinary frequency   . Renal mass   . Hypertension   . Lobular carcinoma of breast ([place])     lumpectomy    Past Surgical History  Procedure Laterality Date  . Partial hysterectomy    . Cervical spine surgery    . Breast lumpectomy      sentinel node biopsy  . Wrist surgery Left     ganglion with reoccurence  . Toe surgery Left     4th & 5th  . Abdominal hysterectomy  1992    fibroid- [facility]    Review of systems: Per HPI.  OBJECTIVE: BP 132/82 mmHg  Pulse 86  Ht 5\' 6"  (1.676 m)  Wt 154 lb 12.8 oz (70.217 kg)  BMI 25.00 kg/m2 Pleasant, elderly, African-American female in no acute distress. Back: Without CVA tenderness. Abdomen: Soft, nontender, without guarding; no organomegaly. Pelvic: External genitalia-atrophic changes. BUS-normal. Vagina-moderate to severe atrophy; vaginal mucosa at apex with erythematous blebs in mucosa of unclear etiology; no epithelial ulceration. Cervix-surgically absent Uterus-surgically absent. Adnexa-no palpable  masses. Rectovaginal-normal.  External exam.  PROCEDURE: Vaginal biopsy 3 Tischler biopsy forceps is used to take 3 separate biopsies of vaginal mucosa; Monsel solution is applied for hemostasis.  ASSESSMENT: 1.  History of possible vaginal bleeding, unclear etiology. 2.  History of recent urology workup including negative cystoscopy. 3.  History of breast cancer, status post lumpectomy and chemotherapy. 4.  Atrophy of the vagina with atypical erythematous blebs noted on mucosa, unclear etiology.  PLAN: 1.  Vaginal biopsy 3. 2.  Return in 1 week for follow-up and further management planning  Brayton Mars, MD  Note: This dictation was prepared with Dragon dictation along with smaller phrase technology. Any transcriptional errors that result from this process are unintentional.

## 2015-12-03 ENCOUNTER — Ambulatory Visit (INDEPENDENT_AMBULATORY_CARE_PROVIDER_SITE_OTHER): Payer: Commercial Managed Care - HMO | Admitting: Obstetrics and Gynecology

## 2015-12-03 ENCOUNTER — Encounter: Payer: Self-pay | Admitting: Obstetrics and Gynecology

## 2015-12-03 VITALS — BP 130/75 | HR 83 | Ht 66.0 in | Wt 157.5 lb

## 2015-12-03 DIAGNOSIS — N939 Abnormal uterine and vaginal bleeding, unspecified: Secondary | ICD-10-CM | POA: Diagnosis not present

## 2015-12-03 DIAGNOSIS — R319 Hematuria, unspecified: Secondary | ICD-10-CM | POA: Diagnosis not present

## 2015-12-03 DIAGNOSIS — N898 Other specified noninflammatory disorders of vagina: Secondary | ICD-10-CM

## 2015-12-03 DIAGNOSIS — N952 Postmenopausal atrophic vaginitis: Secondary | ICD-10-CM | POA: Diagnosis not present

## 2015-12-03 LAB — POCT URINALYSIS DIPSTICK
BILIRUBIN UA: NEGATIVE
GLUCOSE UA: NEGATIVE
Ketones, UA: NEGATIVE
Leukocytes, UA: NEGATIVE
Nitrite, UA: NEGATIVE
Protein, UA: NEGATIVE
SPEC GRAV UA: 1.015
UROBILINOGEN UA: 0.2
pH, UA: 6.5

## 2015-12-03 MED ORDER — AZITHROMYCIN 1 G PO PACK: 1.0000 g | PACK | Freq: Once | ORAL | Status: DC

## 2015-12-03 NOTE — Progress Notes (Signed)
Chief complaint: 1. Vaginal bleeding  Patient presents for follow-up on vaginal biopsies done for evaluation of vaginal bleeding. Patient is status post hysterectomy. She has history of breast cancer. She has had a urology workup for hematuria which was negative for abnormalities on cystoscopy or CT scan.  Vaginal biopsy 3 last week was notable for acute and chronic vaginitis with some koilocytosis present without dysplasia.  Patient states that she still is experiencing some faint bleeding on wiping her perineum.  OBJECTIVE: BP 130/75 mmHg  Pulse 83  Ht 5\' 6"  (1.676 m)  Wt 157 lb 8 oz (71.442 kg)  BMI 25.43 kg/m2  Urinalysis notable for blood.  ASSESSMENT: 1. Vaginal bleeding 2. Biopsies demonstrate acute and chronic vaginitis without evidence of cancer, or precancer. Koilocytosis (HPV effect is present)  3. Vaginal atrophy  . PLAN: 1. Zithromax 1 g orally (empiric treatment for acute and chronic vaginitis) 2. Return in 3 months for follow-up and Pap smear 3. Use vaginal moisturizers twice a week (samples of Luvena are given) 4.  Nu swab GC/CT/Trich/Yeast/BV 5.  Urine culture 6. Keep urology appointment as scheduled  A total of 15 minutes were spent face-to-face with the patient during this encounter and over half of that time dealt with counseling and coordination of care.  Brayton Mars, MD  Note: This dictation was prepared with Dragon dictation along with smaller phrase technology. Any transcriptional errors that result from this process are unintentional.

## 2015-12-03 NOTE — Patient Instructions (Addendum)
1. Results from biopsies did NOT demonstrate cancer, precancer. 2. Zithromax 1 g by mouth for acute and chronic vaginitis 3. Return in 3 months for follow-up on vaginal bleeding and Pap smear 4. Use vaginal moisturizer twice a week-Luvena or Replens 5. Urine culture sent to rule out bladder infection 6. Vaginal swab to rule out etiology vaginitis sent

## 2015-12-03 NOTE — Addendum Note (Signed)
Addended by: Kerry Hough on: 12/03/2015 01:48 PM   Modules accepted: Orders

## 2015-12-04 LAB — URINE CULTURE: ORGANISM ID, BACTERIA: NO GROWTH

## 2015-12-06 LAB — NUSWAB VAGINITIS PLUS (VG+)
ATOPOBIUM VAGINAE: HIGH {score} — AB
CANDIDA ALBICANS, NAA: NEGATIVE
Candida glabrata, NAA: NEGATIVE
Chlamydia trachomatis, NAA: NEGATIVE
NEISSERIA GONORRHOEAE, NAA: NEGATIVE
Trich vag by NAA: POSITIVE — AB

## 2015-12-07 ENCOUNTER — Telehealth: Payer: Self-pay

## 2015-12-07 MED ORDER — METRONIDAZOLE 500 MG PO TABS: 500.0000 mg | ORAL_TABLET | Freq: Two times a day (BID) | ORAL | Status: DC

## 2015-12-07 NOTE — Telephone Encounter (Signed)
-----   Message from Brayton Mars, MD sent at 12/06/2015  9:21 PM EST ----- Please notify - Abnormal Labs Pt has Trichomonas Call in Metronidazole 500 mg Twice daily for 7 days; Partner also needs treatment. Avoid intercourse until both are treated.

## 2015-12-07 NOTE — Telephone Encounter (Signed)
Pt aware. Med erx. 

## 2015-12-11 ENCOUNTER — Telehealth: Payer: Self-pay | Admitting: Obstetrics and Gynecology

## 2015-12-11 NOTE — Telephone Encounter (Signed)
Pt was put on antibiotic for infection, when she gets up her pee is the color of tea. After that its ok.

## 2015-12-11 NOTE — Telephone Encounter (Signed)
Pt states when she wakes up in the am her urine is a dark color. As the day goes on it gets lighter. NO pain with urination. Last culture 2/16- neg. Pt advised urine is concentrated d/t to sitting in bladder for 7-8 hours. Stay hydrated. Contact office if pain develops.

## 2015-12-18 ENCOUNTER — Ambulatory Visit
Admission: RE | Admit: 2015-12-18 | Discharge: 2015-12-18 | Disposition: A | Discharge status: Home / Self Care | Payer: Commercial Managed Care - HMO | Source: Ambulatory Visit | Attending: Urology | Admitting: Urology

## 2015-12-18 DIAGNOSIS — N2889 Other specified disorders of kidney and ureter: Secondary | ICD-10-CM | POA: Diagnosis not present

## 2015-12-18 DIAGNOSIS — N289 Disorder of kidney and ureter, unspecified: Secondary | ICD-10-CM | POA: Diagnosis not present

## 2015-12-18 MED ORDER — IOHEXOL 350 MG/ML SOLN
100.0000 mL | Freq: Once | INTRAVENOUS | Status: AC | PRN
  Administered 2015-12-18: 100 mL via INTRAVENOUS

## 2015-12-23 ENCOUNTER — Ambulatory Visit: Payer: Commercial Managed Care - HMO

## 2015-12-24 ENCOUNTER — Encounter (INDEPENDENT_AMBULATORY_CARE_PROVIDER_SITE_OTHER): Payer: Commercial Managed Care - HMO | Admitting: Urology

## 2015-12-24 ENCOUNTER — Encounter: Payer: Self-pay | Admitting: Urology

## 2015-12-24 VITALS — BP 122/78 | HR 78 | Ht 66.0 in | Wt 156.4 lb

## 2015-12-24 DIAGNOSIS — R310 Gross hematuria: Secondary | ICD-10-CM | POA: Diagnosis not present

## 2015-12-24 DIAGNOSIS — N2889 Other specified disorders of kidney and ureter: Secondary | ICD-10-CM | POA: Diagnosis not present

## 2015-12-24 LAB — URINALYSIS, COMPLETE
Bilirubin, UA: NEGATIVE
GLUCOSE, UA: NEGATIVE
KETONES UA: NEGATIVE
Leukocytes, UA: NEGATIVE
NITRITE UA: NEGATIVE
Protein, UA: NEGATIVE
SPEC GRAV UA: 1.02 (ref 1.005–1.030)
UUROB: 0.2 mg/dL (ref 0.2–1.0)
pH, UA: 6.5 (ref 5.0–7.5)

## 2015-12-24 LAB — MICROSCOPIC EXAMINATION
Bacteria, UA: NONE SEEN
WBC UA: NONE SEEN /HPF (ref 0–?)

## 2015-12-24 NOTE — Progress Notes (Signed)
12/24/2015 10:04 AM   Erica Stone 1961/01/31 [id number]  Referring provider: Idelle Crouch, MD [place] [facility] [place], [place] [address]  Chief Complaint  Patient presents with  . Follow-up    gross hematuria, renal mass     HPI: The patient is a 55 year old female presents for follow-up of a 11 mm indeterminant lesion seen during her recent hematuria workup. This mass is in the upper pole of the left kidney is approximately 11 mm. On follow-up imaging today, it is stable in size from at least November 2015. It cannot be ruled out as malignancy and MRI follow-up was warranted. However the patient cannot tolerate gadolinium contrast, so she is unable to undergo an MRI. She is being followed with active surveillance of this mass.   PMH: Past Medical History  Diagnosis Date  . Neuropathy ([place])   . Bipolar disorder ([place])   . DDD (degenerative disc disease), cervical   . Depression   . Urinary frequency   . Renal mass   . Hypertension   . Lobular carcinoma of breast [facility])     lumpectomy    Surgical History: Past Surgical History  Procedure Laterality Date  . Partial hysterectomy    . Cervical spine surgery    . Breast lumpectomy      sentinel node biopsy  . Wrist surgery Left     ganglion with reoccurence  . Toe surgery Left     4th & 5th  . Abdominal hysterectomy  1992    fibroid- PJR    Home Medications:    Medication List       This list is accurate as of: 12/24/15 10:04 AM.  Always use your most recent med list.               azithromycin 1 g powder  Commonly known as:  ZITHROMAX  Take 1 packet (1 g total) by mouth once.     Calcium Citrate-Vitamin D3 1000-400 Liqd  Take by mouth.     cloNIDine 0.1 mg/24hr patch  Commonly known as:  CATAPRES - Dosed in mg/24 hr  Place 1 patch (0.1 mg total) onto the skin once a week.     cyclobenzaprine 10 MG tablet  Commonly known as:  FLEXERIL  Take by mouth.     D 2000 2000  units Tabs  Generic drug:  Cholecalciferol  Take by mouth. Reported on 12/24/2015     DAILY COMBO MULTIVITS/CALCIUM PO  Take by mouth.     gabapentin 600 MG tablet  Commonly known as:  NEURONTIN  Take by mouth.     metroNIDAZOLE 500 MG tablet  Commonly known as:  FLAGYL  Take 1 tablet (500 mg total) by mouth 2 (two) times daily.     triamterene-hydrochlorothiazide 37.5-25 MG tablet  Commonly known as:  MAXZIDE-25  TAKE ONE (1) TABLET EACH DAY     vitamin E 1000 UNIT capsule  Take by mouth.        Allergies:  Allergies  Allergen Reactions  . Other     Other reaction(s): Other (See Comments) Gotalonium IV dye- hot with vomiting    Family History: Family History  Problem Relation Age of Onset  . Prostate cancer Father   . Prostate cancer Brother   . Diabetes Mother   . Breast cancer Neg Hx   . Ovarian cancer Neg Hx     Social History:  reports that she has been smoking Cigarettes.  She has  a 18 pack-year smoking history. She does not have any smokeless tobacco history on file. She reports that she drinks alcohol. She reports that she does not use illicit drugs.  ROS:                                        Physical Exam: BP 122/78 mmHg  Pulse 78  Ht 5\' 6"  (1.676 m)  Wt 156 lb 6.4 oz (70.943 kg)  BMI 25.26 kg/m2  Constitutional:  Alert and oriented, No acute distress. HEENT: [place] AT, moist mucus membranes.  Trachea midline, no masses. Cardiovascular: No clubbing, cyanosis, or edema. Respiratory: Normal respiratory effort, no increased work of breathing. GI: Abdomen is soft, nontender, nondistended, no abdominal masses GU: No CVA tenderness.  Skin: No rashes, bruises or suspicious lesions. Lymph: No cervical or inguinal adenopathy. Neurologic: Grossly intact, no focal deficits, moving all 4 extremities. Psychiatric: Normal mood and affect.  Laboratory Data: No results found for: WBC, HGB, HCT, MCV, PLT  Lab Results  Component Value  Date   CREATININE 0.83 05/25/2015    No results found for: PSA  No results found for: TESTOSTERONE  No results found for: HGBA1C  Urinalysis    Component Value Date/Time   GLUCOSEU Negative 06/25/2015 1133   BILIRUBINUR neg 12/03/2015 0834   BILIRUBINUR Negative 06/25/2015 1133   PROTEINUR neg 12/03/2015 0834   UROBILINOGEN 0.2 12/03/2015 0834   NITRITE neg 12/03/2015 0834   NITRITE Negative 06/25/2015 1133   LEUKOCYTESUR Negative 12/03/2015 0834   LEUKOCYTESUR Trace* 06/25/2015 1133    Pertinent Imaging: CLINICAL DATA: Left mid abdominal pain. History of right breast cancer.  EXAM: CT ABDOMEN AND PELVIS WITHOUT AND WITH CONTRAST  TECHNIQUE: Multidetector CT imaging of the abdomen and pelvis was performed following the standard protocol before and following the bolus administration of intravenous contrast.  CONTRAST: 1104mL OMNIPAQUE IOHEXOL 350 MG/ML SOLN  COMPARISON: 06/02/2015  FINDINGS: Lower chest: Unremarkable.  Hepatobiliary: No focal abnormality within the liver parenchyma. There is no evidence for gallstones, gallbladder wall thickening, or pericholecystic fluid. No intrahepatic or extrahepatic biliary dilation.  Pancreas: No focal mass lesion. No dilatation of the main duct. No intraparenchymal cyst. No peripancreatic edema.  Spleen: No splenomegaly. No focal mass lesion.  Adrenals/Urinary Tract: No adrenal nodule or mass. Tiny (3 mm) hypodensities in the upper pole the right kidney are too small to characterize but are likely cysts. 12 mm lesion in the upper pole the left kidney has attenuation too high to represent a simple cyst. However, this is isointense to renal parenchyma on precontrast imaging so precontrast measurement may not be reliable, but features do suggest that this lesion enhances. No evidence for hydronephrosis. No evidence for hydroureter. The urinary bladder appears normal for the degree of  distention.  Stomach/Bowel: Stomach is nondistended. No gastric wall thickening. No evidence of outlet obstruction. Duodenum is normally positioned as is the ligament of Treitz. No small bowel wall thickening. No small bowel dilatation. The terminal ileum is normal. The appendix is normal. Diverticular are seen in the right colon without diverticulitis.  Vascular/Lymphatic: There is abdominal aortic atherosclerosis without aneurysm. There is no gastrohepatic or hepatoduodenal ligament lymphadenopathy. No intraperitoneal or retroperitoneal lymphadenopathy. No pelvic sidewall lymphadenopathy.  Reproductive: Uterus is surgically absent. There is no adnexal mass.  Other: No intraperitoneal free fluid.  Musculoskeletal: Bone windows reveal no worrisome lytic or sclerotic osseous lesions.  IMPRESSION:  1. No findings to explain the patient's history of left mid abdominal pain. 2. 12 mm left renal lesion again noted with apparent low level diffuse enhancement. As papillary renal cell carcinoma could have this appearance, MRI of the abdomen without and with contrast is recommended to further evaluate. 3. Diverticular changes present in the right colon without diverticulitis.  Assessment & Plan:    1. Hematuria Cystoscopy normal. Hematuria workup negative. Due for repeat urinalysis in 6 months.  2. Left renal mass The patient has a 11 mm left indeterminant renal mass seen on imaging in January and then August 2016 with no interval change. She does not tolerate gadolinium contrast for an MRI. As the mass is stable, we will continue surveillance CT scan at six months. If it remains stable that time, we can increase the interval between CT scans.   No Follow-up on file.  Nickie Retort, MD  [place] [address] [place], [place] [address] [phone]   This encounter was created in error - please disregard.

## 2016-01-07 ENCOUNTER — Ambulatory Visit (INDEPENDENT_AMBULATORY_CARE_PROVIDER_SITE_OTHER): Payer: Commercial Managed Care - HMO | Admitting: Urology

## 2016-01-07 ENCOUNTER — Encounter: Payer: Self-pay | Admitting: Urology

## 2016-01-07 VITALS — BP 120/75 | HR 76 | Ht 66.0 in | Wt 156.7 lb

## 2016-01-07 DIAGNOSIS — Q6100 Congenital renal cyst, unspecified: Secondary | ICD-10-CM

## 2016-01-07 DIAGNOSIS — R310 Gross hematuria: Secondary | ICD-10-CM | POA: Diagnosis not present

## 2016-01-07 DIAGNOSIS — N281 Cyst of kidney, acquired: Secondary | ICD-10-CM

## 2016-01-07 NOTE — Progress Notes (Signed)
01/07/2016 11:26 AM   Evette Cristal [birth year] [id number]  Referring provider: Idelle Crouch, MD [place] [facility] [place], [place] [address]  Chief Complaint  Patient presents with  . Follow-up    gross hematuria, renal mass    HPI: The patient is a 55 year old female with a history of gross hematuria and a left renal mass. Her hematuria workup was negative in September 2016. She does have 11 mm indeterminate renal mass that dates back to at least January 2016. Ideally, she would've undergone MRI but she does not tolerate gadolinium contrast. Repeat CT scan in March 2017 again shows this indeterminant renal mass that is stable in size. She was recently treated for an STD by her gynecologist does diagnose the setting of vaginal bleeding. She currently is not having gross hematuria. Recent urinalysis shows 3-10 red blood cells.   PMH: Past Medical History  Diagnosis Date  . Neuropathy ([place])   . Bipolar disorder ([place])   . DDD (degenerative disc disease), cervical   . Depression   . Urinary frequency   . Renal mass   . Hypertension   . Lobular carcinoma of breast [facility])     lumpectomy    Surgical History: Past Surgical History  Procedure Laterality Date  . Partial hysterectomy    . Cervical spine surgery    . Breast lumpectomy      sentinel node biopsy  . Wrist surgery Left     ganglion with reoccurence  . Toe surgery Left     4th & 5th  . Abdominal hysterectomy  1992    fibroid- PJR    Home Medications:    Medication List       This list is accurate as of: 01/07/16 11:26 AM.  Always use your most recent med list.               azithromycin 1 g powder  Commonly known as:  ZITHROMAX  Take 1 packet (1 g total) by mouth once.     Calcium Citrate-Vitamin D3 1000-400 Liqd  Take by mouth.     cloNIDine 0.1 mg/24hr patch  Commonly known as:  CATAPRES - Dosed in mg/24 hr  Place 1 patch (0.1 mg total) onto the skin once a week.     cyclobenzaprine 10 MG tablet  Commonly known as:  FLEXERIL  Take by mouth.     D 2000 2000 units Tabs  Generic drug:  Cholecalciferol  Take by mouth. Reported on 12/24/2015     DAILY COMBO MULTIVITS/CALCIUM PO  Take by mouth.     gabapentin 600 MG tablet  Commonly known as:  NEURONTIN  Take by mouth.     metroNIDAZOLE 500 MG tablet  Commonly known as:  FLAGYL  Take 1 tablet (500 mg total) by mouth 2 (two) times daily.     triamterene-hydrochlorothiazide 37.5-25 MG tablet  Commonly known as:  MAXZIDE-25  TAKE ONE (1) TABLET EACH DAY     vitamin E 1000 UNIT capsule  Take by mouth.        Allergies:  Allergies  Allergen Reactions  . Other     Other reaction(s): Other (See Comments) Gotalonium IV dye- hot with vomiting    Family History: Family History  Problem Relation Age of Onset  . Prostate cancer Father   . Prostate cancer Brother   . Diabetes Mother   . Breast cancer Neg Hx   . Ovarian cancer Neg Hx     Social  History:  reports that she has been smoking Cigarettes.  She has a 18 pack-year smoking history. She does not have any smokeless tobacco history on file. She reports that she drinks alcohol. She reports that she does not use illicit drugs.  ROS:                                        Physical Exam: BP 120/75 mmHg  Pulse 76  Ht 5\' 6"  (1.676 m)  Wt 156 lb 11.2 oz (71.079 kg)  BMI 25.30 kg/m2  Constitutional:  Alert and oriented, No acute distress. HEENT: [place] AT, moist mucus membranes.  Trachea midline, no masses. Cardiovascular: No clubbing, cyanosis, or edema. Respiratory: Normal respiratory effort, no increased work of breathing. GI: Abdomen is soft, nontender, nondistended, no abdominal masses GU: No CVA tenderness.  Skin: No rashes, bruises or suspicious lesions. Lymph: No cervical or inguinal adenopathy. Neurologic: Grossly intact, no focal deficits, moving all 4 extremities. Psychiatric: Normal mood and  affect.  Laboratory Data: No results found for: WBC, HGB, HCT, MCV, PLT  Lab Results  Component Value Date   CREATININE 0.83 05/25/2015    No results found for: PSA  No results found for: TESTOSTERONE  No results found for: HGBA1C  Urinalysis    Component Value Date/Time   APPEARANCEUR Clear 12/24/2015 0959   GLUCOSEU Negative 12/24/2015 0959   BILIRUBINUR Negative 12/24/2015 0959   BILIRUBINUR neg 12/03/2015 0834   PROTEINUR Negative 12/24/2015 0959   PROTEINUR neg 12/03/2015 0834   UROBILINOGEN 0.2 12/03/2015 0834   NITRITE Negative 12/24/2015 0959   NITRITE neg 12/03/2015 0834   LEUKOCYTESUR Negative 12/24/2015 0959   LEUKOCYTESUR Negative 12/03/2015 0834    Pertinent Imaging: CLINICAL DATA: Left mid abdominal pain. History of right breast cancer.  EXAM: CT ABDOMEN AND PELVIS WITHOUT AND WITH CONTRAST  TECHNIQUE: Multidetector CT imaging of the abdomen and pelvis was performed following the standard protocol before and following the bolus administration of intravenous contrast.  CONTRAST: 168mL OMNIPAQUE IOHEXOL 350 MG/ML SOLN  COMPARISON: 06/02/2015  FINDINGS: Lower chest: Unremarkable.  Hepatobiliary: No focal abnormality within the liver parenchyma. There is no evidence for gallstones, gallbladder wall thickening, or pericholecystic fluid. No intrahepatic or extrahepatic biliary dilation.  Pancreas: No focal mass lesion. No dilatation of the main duct. No intraparenchymal cyst. No peripancreatic edema.  Spleen: No splenomegaly. No focal mass lesion.  Adrenals/Urinary Tract: No adrenal nodule or mass. Tiny (3 mm) hypodensities in the upper pole the right kidney are too small to characterize but are likely cysts. 12 mm lesion in the upper pole the left kidney has attenuation too high to represent a simple cyst. However, this is isointense to renal parenchyma on precontrast imaging so precontrast measurement may not be reliable, but  features do suggest that this lesion enhances. No evidence for hydronephrosis. No evidence for hydroureter. The urinary bladder appears normal for the degree of distention.  Stomach/Bowel: Stomach is nondistended. No gastric wall thickening. No evidence of outlet obstruction. Duodenum is normally positioned as is the ligament of Treitz. No small bowel wall thickening. No small bowel dilatation. The terminal ileum is normal. The appendix is normal. Diverticular are seen in the right colon without diverticulitis.  Vascular/Lymphatic: There is abdominal aortic atherosclerosis without aneurysm. There is no gastrohepatic or hepatoduodenal ligament lymphadenopathy. No intraperitoneal or retroperitoneal lymphadenopathy. No pelvic sidewall lymphadenopathy.  Reproductive: Uterus is surgically absent. There  is no adnexal mass.  Other: No intraperitoneal free fluid.  Musculoskeletal: Bone windows reveal no worrisome lytic or sclerotic osseous lesions.  IMPRESSION: 1. No findings to explain the patient's history of left mid abdominal pain. 2. 12 mm left renal lesion again noted with apparent low level diffuse enhancement. As papillary renal cell carcinoma could have this appearance, MRI of the abdomen without and with contrast is recommended to further evaluate. 3. Diverticular changes present in the right colon without diverticulitis.  Assessment & Plan:   1. Gross Hematuria Negative hematuria workup in September 2016.  2. Left renal mass The patient has a 11 mm left indeterminant renal mass that has been stable in size since at least January 2016. She does not tolerate gadolinium contrast for an MRI so she is unable to undergo MRI renal protocol for further characterization. I offered the patient multiple options including IR cryoablation and biopsy of the mass as well as continued surveillance. She does know that theoretically in indeterminate surveillance the mass could grow in  size and theoretically spread it is a malignancy. This is not extremely likely given that the mass has been stable in size for 16 months now. Will follow-up in one year with a renal ultrasound.  Return in about 1 year (around 01/06/2017) for with renal ultrasound prior.  Nickie Retort, MD  [facility] [address], [place], [place] [address] [phone]

## 2016-01-18 DIAGNOSIS — H2513 Age-related nuclear cataract, bilateral: Secondary | ICD-10-CM | POA: Diagnosis not present

## 2016-03-01 ENCOUNTER — Ambulatory Visit: Payer: Commercial Managed Care - HMO | Admitting: Obstetrics and Gynecology

## 2016-03-03 ENCOUNTER — Ambulatory Visit: Payer: Commercial Managed Care - HMO | Admitting: Obstetrics and Gynecology

## 2016-03-29 ENCOUNTER — Ambulatory Visit: Payer: Commercial Managed Care - HMO | Admitting: Obstetrics and Gynecology

## 2016-04-20 ENCOUNTER — Ambulatory Visit: Payer: Commercial Managed Care - HMO | Admitting: Obstetrics and Gynecology

## 2016-06-09 DIAGNOSIS — R7309 Other abnormal glucose: Secondary | ICD-10-CM | POA: Diagnosis not present

## 2016-06-09 DIAGNOSIS — Z79899 Other long term (current) drug therapy: Secondary | ICD-10-CM | POA: Diagnosis not present

## 2016-06-09 DIAGNOSIS — R1012 Left upper quadrant pain: Secondary | ICD-10-CM | POA: Diagnosis not present

## 2016-06-09 DIAGNOSIS — Z1329 Encounter for screening for other suspected endocrine disorder: Secondary | ICD-10-CM | POA: Diagnosis not present

## 2016-06-09 DIAGNOSIS — I10 Essential (primary) hypertension: Secondary | ICD-10-CM | POA: Diagnosis not present

## 2016-06-09 DIAGNOSIS — R634 Abnormal weight loss: Secondary | ICD-10-CM | POA: Diagnosis not present

## 2016-06-09 DIAGNOSIS — M67432 Ganglion, left wrist: Secondary | ICD-10-CM | POA: Diagnosis not present

## 2016-06-09 DIAGNOSIS — Z0000 Encounter for general adult medical examination without abnormal findings: Secondary | ICD-10-CM | POA: Diagnosis not present

## 2016-06-09 DIAGNOSIS — G629 Polyneuropathy, unspecified: Secondary | ICD-10-CM | POA: Diagnosis not present

## 2016-06-17 DIAGNOSIS — M67432 Ganglion, left wrist: Secondary | ICD-10-CM | POA: Diagnosis not present

## 2016-06-17 DIAGNOSIS — M25532 Pain in left wrist: Secondary | ICD-10-CM | POA: Diagnosis not present

## 2016-06-24 ENCOUNTER — Other Ambulatory Visit: Payer: Self-pay | Admitting: Internal Medicine

## 2016-06-24 DIAGNOSIS — Z1231 Encounter for screening mammogram for malignant neoplasm of breast: Secondary | ICD-10-CM

## 2016-07-15 ENCOUNTER — Ambulatory Visit
Admission: RE | Admit: 2016-07-15 | Discharge: 2016-07-15 | Disposition: A | Discharge status: Home / Self Care | Payer: Commercial Managed Care - HMO | Source: Ambulatory Visit | Attending: Internal Medicine | Admitting: Internal Medicine

## 2016-07-15 DIAGNOSIS — Z1231 Encounter for screening mammogram for malignant neoplasm of breast: Secondary | ICD-10-CM | POA: Insufficient documentation

## 2016-07-15 HISTORY — DX: Personal history of antineoplastic chemotherapy: Z92.21

## 2016-07-15 HISTORY — DX: Personal history of irradiation: Z92.3

## 2016-07-18 DIAGNOSIS — M67432 Ganglion, left wrist: Secondary | ICD-10-CM | POA: Diagnosis not present

## 2016-07-26 ENCOUNTER — Encounter
Admission: RE | Admit: 2016-07-26 | Discharge: 2016-07-26 | Disposition: A | Discharge status: Home / Self Care | Payer: Commercial Managed Care - HMO | Source: Ambulatory Visit | Attending: Orthopedic Surgery | Admitting: Orthopedic Surgery

## 2016-07-26 NOTE — Patient Instructions (Signed)
  Your procedure is scheduled on: 07-28-16 [facility]) Report to Same Day Surgery 2nd floor medical mall To find out your arrival time please call [phone] between 1PM - 3PM on 07-27-16 [facility])  Remember: Instructions that are not followed completely may result in serious medical risk, up to and including death, or upon the discretion of your surgeon and anesthesiologist your surgery may need to be rescheduled.    _x___ 1. Do not eat food or drink liquids after midnight. No gum chewing or hard candies.     __x__ 2. No Alcohol for 24 hours before or after surgery.   __x__3. No Smoking for 24 prior to surgery.   ____  4. Bring all medications with you on the day of surgery if instructed.    __x__ 5. Notify your doctor if there is any change in your medical condition     (cold, fever, infections).     Do not wear jewelry, make-up, hairpins, clips or nail polish.  Do not wear lotions, powders, or perfumes. You may wear deodorant.  Do not shave 48 hours prior to surgery. Men may shave face and neck.  Do not bring valuables to the hospital.    [facility] is not responsible for any belongings or valuables.               Contacts, dentures or bridgework may not be worn into surgery.  Leave your suitcase in the car. After surgery it may be brought to your room.  For patients admitted to the hospital, discharge time is determined by your treatment team.   Patients discharged the day of surgery will not be allowed to drive home.    Please read over the following fact sheets that you were given:   [facility] Preparing for Surgery and or MRSA Information   _x___ Take these medicines the morning of surgery with A SIP OF WATER:    1. GABAPENTIN  2.  3.  4.  5.  6.  ____Fleets enema or Magnesium Citrate as directed.   _x___ Use CHG Soap or sage wipes as directed on instruction sheet   ____ Use inhalers on the day of surgery and bring to hospital day of surgery  ____ Stop  metformin 2 days prior to surgery    ____ Take 1/2 of usual insulin dose the night before surgery and none on the morning of surgery.   ____ Stop aspirin or coumadin, or plavix  x__ Stop Anti-inflammatories such as Advil, Aleve, Ibuprofen, Motrin, Naproxen,          Naprosyn, Goodies powders or aspirin products. Ok to take Tylenol.   ____ Stop supplements until after surgery.    ____ Bring C-Pap to the hospital.

## 2016-07-27 ENCOUNTER — Encounter
Admission: RE | Admit: 2016-07-27 | Discharge: 2016-07-27 | Disposition: A | Discharge status: Home / Self Care | Payer: Commercial Managed Care - HMO | Source: Ambulatory Visit | Attending: Orthopedic Surgery | Admitting: Orthopedic Surgery

## 2016-07-27 DIAGNOSIS — Z79899 Other long term (current) drug therapy: Secondary | ICD-10-CM | POA: Diagnosis not present

## 2016-07-27 DIAGNOSIS — M1990 Unspecified osteoarthritis, unspecified site: Secondary | ICD-10-CM | POA: Diagnosis not present

## 2016-07-27 DIAGNOSIS — G629 Polyneuropathy, unspecified: Secondary | ICD-10-CM | POA: Diagnosis not present

## 2016-07-27 DIAGNOSIS — M25532 Pain in left wrist: Secondary | ICD-10-CM | POA: Diagnosis present

## 2016-07-27 DIAGNOSIS — I10 Essential (primary) hypertension: Secondary | ICD-10-CM | POA: Diagnosis not present

## 2016-07-27 DIAGNOSIS — Z9221 Personal history of antineoplastic chemotherapy: Secondary | ICD-10-CM | POA: Diagnosis not present

## 2016-07-27 DIAGNOSIS — F17200 Nicotine dependence, unspecified, uncomplicated: Secondary | ICD-10-CM | POA: Diagnosis not present

## 2016-07-27 DIAGNOSIS — M67432 Ganglion, left wrist: Secondary | ICD-10-CM | POA: Diagnosis not present

## 2016-07-27 DIAGNOSIS — Z923 Personal history of irradiation: Secondary | ICD-10-CM | POA: Diagnosis not present

## 2016-07-27 DIAGNOSIS — Z853 Personal history of malignant neoplasm of breast: Secondary | ICD-10-CM | POA: Diagnosis not present

## 2016-07-27 LAB — POTASSIUM: Potassium: 3.4 mmol/L — ABNORMAL LOW (ref 3.5–5.1)

## 2016-07-28 ENCOUNTER — Encounter
Admission: RE | Disposition: A | Discharge status: Home / Self Care | Payer: Self-pay | Source: Ambulatory Visit | Attending: Orthopedic Surgery

## 2016-07-28 ENCOUNTER — Ambulatory Visit: Payer: Commercial Managed Care - HMO | Admitting: Anesthesiology

## 2016-07-28 ENCOUNTER — Ambulatory Visit
Admission: RE | Admit: 2016-07-28 | Discharge: 2016-07-28 | Disposition: A | Discharge status: Home / Self Care | Payer: Commercial Managed Care - HMO | Source: Ambulatory Visit | Attending: Orthopedic Surgery | Admitting: Orthopedic Surgery

## 2016-07-28 ENCOUNTER — Encounter: Payer: Self-pay | Admitting: *Deleted

## 2016-07-28 DIAGNOSIS — M67432 Ganglion, left wrist: Secondary | ICD-10-CM | POA: Insufficient documentation

## 2016-07-28 DIAGNOSIS — I10 Essential (primary) hypertension: Secondary | ICD-10-CM | POA: Diagnosis not present

## 2016-07-28 DIAGNOSIS — Z79899 Other long term (current) drug therapy: Secondary | ICD-10-CM | POA: Insufficient documentation

## 2016-07-28 DIAGNOSIS — Z853 Personal history of malignant neoplasm of breast: Secondary | ICD-10-CM | POA: Insufficient documentation

## 2016-07-28 DIAGNOSIS — Z9221 Personal history of antineoplastic chemotherapy: Secondary | ICD-10-CM | POA: Diagnosis not present

## 2016-07-28 DIAGNOSIS — F17200 Nicotine dependence, unspecified, uncomplicated: Secondary | ICD-10-CM | POA: Insufficient documentation

## 2016-07-28 DIAGNOSIS — M1990 Unspecified osteoarthritis, unspecified site: Secondary | ICD-10-CM | POA: Insufficient documentation

## 2016-07-28 DIAGNOSIS — M71332 Other bursal cyst, left wrist: Secondary | ICD-10-CM | POA: Diagnosis not present

## 2016-07-28 DIAGNOSIS — Z923 Personal history of irradiation: Secondary | ICD-10-CM | POA: Insufficient documentation

## 2016-07-28 DIAGNOSIS — G629 Polyneuropathy, unspecified: Secondary | ICD-10-CM | POA: Insufficient documentation

## 2016-07-28 HISTORY — PX: GANGLION CYST EXCISION: SHX1691

## 2016-07-28 SURGERY — EXCISION, GANGLION CYST, WRIST
Anesthesia: General | Site: Wrist | Laterality: Left | Wound class: Clean

## 2016-07-28 MED ORDER — FENTANYL CITRATE (PF) 100 MCG/2ML IJ SOLN: 25.0000 ug | INTRAMUSCULAR | Status: DC | PRN

## 2016-07-28 MED ORDER — CEFAZOLIN SODIUM-DEXTROSE 2-4 GM/100ML-% IV SOLN
INTRAVENOUS | Status: AC
  Filled 2016-07-28: qty 100

## 2016-07-28 MED ORDER — BUPIVACAINE-EPINEPHRINE (PF) 0.5% -1:200000 IJ SOLN
INTRAMUSCULAR | Status: AC
  Filled 2016-07-28: qty 30

## 2016-07-28 MED ORDER — FAMOTIDINE 20 MG PO TABS
ORAL_TABLET | ORAL | Status: AC
  Administered 2016-07-28: 20 mg via ORAL
  Filled 2016-07-28: qty 1

## 2016-07-28 MED ORDER — DEXAMETHASONE SODIUM PHOSPHATE 10 MG/ML IJ SOLN
INTRAMUSCULAR | Status: DC | PRN
  Administered 2016-07-28: 10 mg via INTRAVENOUS

## 2016-07-28 MED ORDER — ONDANSETRON HCL 4 MG/2ML IJ SOLN
INTRAMUSCULAR | Status: DC | PRN
  Administered 2016-07-28: 4 mg via INTRAVENOUS

## 2016-07-28 MED ORDER — MIDAZOLAM HCL 2 MG/2ML IJ SOLN
INTRAMUSCULAR | Status: DC | PRN
  Administered 2016-07-28: 1 mg via INTRAVENOUS

## 2016-07-28 MED ORDER — ONDANSETRON HCL 4 MG/2ML IJ SOLN: 4.0000 mg | Freq: Once | INTRAMUSCULAR | Status: DC | PRN

## 2016-07-28 MED ORDER — BUPIVACAINE HCL 0.5 % IJ SOLN
INTRAMUSCULAR | Status: DC | PRN
  Administered 2016-07-28: 10 mL

## 2016-07-28 MED ORDER — LACTATED RINGERS IV SOLN
INTRAVENOUS | Status: DC
  Administered 2016-07-28: 11:00:00 via INTRAVENOUS

## 2016-07-28 MED ORDER — HYDROCODONE-ACETAMINOPHEN 5-325 MG PO TABS: 1.0000 | ORAL_TABLET | Freq: Four times a day (QID) | ORAL | 0 refills | Status: DC | PRN

## 2016-07-28 MED ORDER — PROPOFOL 10 MG/ML IV BOLUS
INTRAVENOUS | Status: DC | PRN
  Administered 2016-07-28: 140 mg via INTRAVENOUS

## 2016-07-28 MED ORDER — CEFAZOLIN SODIUM-DEXTROSE 2-4 GM/100ML-% IV SOLN
2.0000 g | Freq: Once | INTRAVENOUS | Status: AC
  Administered 2016-07-28: 2 g via INTRAVENOUS

## 2016-07-28 MED ORDER — BUPIVACAINE HCL (PF) 0.5 % IJ SOLN
INTRAMUSCULAR | Status: AC
  Filled 2016-07-28: qty 30

## 2016-07-28 MED ORDER — GLYCOPYRROLATE 0.2 MG/ML IJ SOLN
INTRAMUSCULAR | Status: DC | PRN
  Administered 2016-07-28: 0.2 mg via INTRAVENOUS

## 2016-07-28 MED ORDER — LIDOCAINE HCL (CARDIAC) 20 MG/ML IV SOLN
INTRAVENOUS | Status: DC | PRN
  Administered 2016-07-28: 30 mg via INTRAVENOUS

## 2016-07-28 MED ORDER — FENTANYL CITRATE (PF) 100 MCG/2ML IJ SOLN
INTRAMUSCULAR | Status: DC | PRN
  Administered 2016-07-28 (×2): 25 ug via INTRAVENOUS
  Administered 2016-07-28: 50 ug via INTRAVENOUS

## 2016-07-28 MED ORDER — FAMOTIDINE 20 MG PO TABS
20.0000 mg | ORAL_TABLET | Freq: Once | ORAL | Status: AC
  Administered 2016-07-28: 20 mg via ORAL

## 2016-07-28 SURGICAL SUPPLY — 31 items
BANDAGE ELASTIC 3 LF NS (GAUZE/BANDAGES/DRESSINGS) ×3 IMPLANT
BNDG ESMARK 4X12 TAN STRL LF (GAUZE/BANDAGES/DRESSINGS) ×3 IMPLANT
CANISTER SUCT 1200ML W/VALVE (MISCELLANEOUS) ×3 IMPLANT
CAST PADDING 3X4FT ST 30246 (SOFTGOODS) ×2
CHLORAPREP W/TINT 26ML (MISCELLANEOUS) ×3 IMPLANT
CUFF TOURN 18 STER (MISCELLANEOUS) ×3 IMPLANT
ELECT CAUTERY NEEDLE 2.0 MIC (NEEDLE) ×3 IMPLANT
GAUZE PETRO XEROFOAM 1X8 (MISCELLANEOUS) ×3 IMPLANT
GAUZE SPONGE 4X4 12PLY STRL (GAUZE/BANDAGES/DRESSINGS) ×3 IMPLANT
GLOVE BIOGEL PI IND STRL 9 (GLOVE) ×1 IMPLANT
GLOVE BIOGEL PI INDICATOR 9 (GLOVE) ×2
GLOVE SURG SYN 9.0  PF PI (GLOVE) ×2
GLOVE SURG SYN 9.0 PF PI (GLOVE) ×1 IMPLANT
GOWN SRG 2XL LVL 4 RGLN SLV (GOWNS) ×1 IMPLANT
GOWN STRL NON-REIN 2XL LVL4 (GOWNS) ×2
GOWN STRL REUS W/ TWL LRG LVL3 (GOWN DISPOSABLE) ×1 IMPLANT
GOWN STRL REUS W/TWL LRG LVL3 (GOWN DISPOSABLE) ×2
KIT RM TURNOVER STRD PROC AR (KITS) ×3 IMPLANT
NEEDLE HYPO 25X1 1.5 SAFETY (NEEDLE) ×3 IMPLANT
NS IRRIG 500ML POUR BTL (IV SOLUTION) ×3 IMPLANT
PACK EXTREMITY ARMC (MISCELLANEOUS) ×3 IMPLANT
PAD CAST CTTN 3X4 STRL (SOFTGOODS) ×1 IMPLANT
SPLINT CAST 1 STEP 3X12 (MISCELLANEOUS) ×3 IMPLANT
STOCKINETTE STRL 4IN 9604848 (GAUZE/BANDAGES/DRESSINGS) ×3 IMPLANT
SUT ETHILON 4-0 (SUTURE) ×2
SUT ETHILON 4-0 FS2 18XMFL BLK (SUTURE) ×1
SUT ETHILON 5-0 FS-2 18 BLK (SUTURE) ×3 IMPLANT
SUT MNCRL 4-0 (SUTURE) ×2
SUT MNCRL 4-018XMFL (SUTURE) ×1
SUTURE ETHLN 4-0 FS2 18XMF BLK (SUTURE) ×1 IMPLANT
SUTURE MNCRL 4-018XMF (SUTURE) ×1 IMPLANT

## 2016-07-28 NOTE — Progress Notes (Signed)
Left hand elevated on pillow 

## 2016-07-28 NOTE — Anesthesia Postprocedure Evaluation (Signed)
Anesthesia Post Note  Patient: Erica Stone  Procedure(s) Performed: Procedure(s) (LRB): REMOVAL GANGLION OF WRIST (Left)  Patient location during evaluation: PACU Anesthesia Type: General Level of consciousness: awake and alert Pain management: pain level controlled Vital Signs Assessment: post-procedure vital signs reviewed and stable Respiratory status: spontaneous breathing and respiratory function stable Cardiovascular status: stable Anesthetic complications: no    Last Vitals:  Vitals:   07/28/16 1150 07/28/16 1202  BP: (!) 147/91   Pulse: 76 71  Resp: 17 12  Temp: 36.2 C     Last Pain:  Vitals:   07/28/16 1202  TempSrc:   PainSc: Asleep                 Orly Quimby K

## 2016-07-28 NOTE — Op Note (Signed)
07/28/2016  11:47 AM  PATIENT:  Erica Stone  55 y.o. female  PRE-OPERATIVE DIAGNOSIS:  GANGLION CYST LEFT dorsal wrist  POST-OPERATIVE DIAGNOSIS:  GANGLION CYST LEFT dorsal wrist  PROCEDURE:  Procedure(s): REMOVAL GANGLION OF WRIST (Left)  SURGEON: Laurene Footman, MD  ASSISTANTS: None  ANESTHESIA:   general  EBL:  Total I/O In: 400 [I.V.:400] Out: 0   BLOOD ADMINISTERED:none  DRAINS: none   LOCAL MEDICATIONS USED:  MARCAINE     SPECIMEN:  Source of Specimen:  Dorsal wrist ganglion cyst  DISPOSITION OF SPECIMEN:  PATHOLOGY  COUNTS:  YES  TOURNIQUET:   22 minutes at 250 mmHg  IMPLANTS: None  DICTATION: .Dragon Dictation patient brought the operating room and after adequate anesthesia was obtained, left arm was prepped and draped in sterile fashion with a tourniquet applied the upper arm. After appropriate patient identification and timeout procedure were completed, tourniquet was raised. Going to the prior dorsal incision of skin incision was made just distal to the ganglion. Subcutaneous tissues tissue spread and the ganglion was exposed without difficulty. Tendons were retracted to protect them and the stalk of the ganglion was identified going down to the radiocarpal joint. The cyst was then excised and sent as specimen. At the base of the ganglion there was a small hole in the capsule dorsally the radiocarpal joint this was debrided with use of a Soil scientist and cautery to try to encourage scar tissue and prevent recurrence. This was completed the wound was thoroughly irrigated and 10 cc of half percent Sensorcaine with epinephrine infiltrated sterile dressing was applied after subcuticular closure with 4-0 Monocryl and Dermabond volar splint was placed followed by an Ace wrap tourniquet time was 22 minutes or no complications of specimen again [place] cyst  PLAN OF CARE: Discharge to home after PACU  PATIENT DISPOSITION:  PACU - hemodynamically stable.

## 2016-07-28 NOTE — Anesthesia Preprocedure Evaluation (Signed)
Anesthesia Evaluation  Patient identified by MRN, date of birth, ID band Patient awake    Reviewed: Allergy & Precautions, NPO status , Patient's Chart, lab work & pertinent test results  History of Anesthesia Complications Negative for: history of anesthetic complications  Airway Mallampati: II       Dental   Pulmonary Current Smoker,           Cardiovascular hypertension, Pt. on medications      Neuro/Psych negative neurological ROS     GI/Hepatic negative GI ROS, Neg liver ROS,   Endo/Other  negative endocrine ROS  Renal/GU negative Renal ROS     Musculoskeletal  (+) Arthritis , Osteoarthritis,    Abdominal   Peds  Hematology negative hematology ROS (+)   Anesthesia Other Findings   Reproductive/Obstetrics                            Anesthesia Physical Anesthesia Plan  ASA: II  Anesthesia Plan: General   Post-op Pain Management:    Induction:   Airway Management Planned: LMA  Additional Equipment:   Intra-op Plan:   Post-operative Plan:   Informed Consent: I have reviewed the patients History and Physical, chart, labs and discussed the procedure including the risks, benefits and alternatives for the proposed anesthesia with the patient or authorized representative who has indicated his/her understanding and acceptance.     Plan Discussed with:   Anesthesia Plan Comments:         Anesthesia Quick Evaluation

## 2016-07-28 NOTE — H&P (Signed)
Chief Complaint  Patient presents with  . Wrist Pain  left wrist cyst   Erica Stone is a 55 y.o. female who presents today for evaluation of left wrist dorsal ganglion cyst. Cyst is been present for 3-4 months. She had cyst removed in the same area 20 years ago. She denies any trauma or injury pain is moderate. No numbness or tingling. Pain is increased with bending and flexing the wrist. She has not worn a Velcro wrist brace. She is currently not working, on disability, denies any repetitive wrist range of motion except for yardwork. She denies any warmth erythema or drainage. No fevers. No history of gout. she had aspiration and injection with no relief and comes in now for surgery   Past Medical History: Past Medical History:  Diagnosis Date  . Depression  . Hx of bipolar disorder  Followed by Dr. Gaspar Garbe  . Hypertension  . Lobular carcinoma of breast (CMS-HCC) 08/2005  Right lobular carcinoma of the breast, 08/2005  . Menopausal state 02/27/2006  . Neuropathy (CMS-HCC)  EMG's negative; carpel tunnel   Past Surgical History: Past Surgical History:  Procedure Laterality Date  . C-Spine surgery, cervical fusion (C5-C7) 02/2010  . COLONOSCOPY 12/09/2009  Repeat 5 years.  . EGD 04/02/2009  Dr. Beverly Gust [facility].  . HYSTERECTOMY 1993  Dr. Laurey Morale. Because of fibroid tumors which did require repair of the bowel due to adherence of fibroid tumor to intestine. Ovaries remain  . Left 4th and 5th toe surgeries  . Left wrist ganglion removal with reoccurence  . Lumpectomy, sentinel node biopsy 09/2005  Chemotherapy, radiation (Dr. Pandit/Dr. Baruch Gouty). Port-A-Cath placed   Past Family History: Family History  Problem Relation Age of Onset  . No Known Problems Mother  . Prostate cancer Father  . Kidney failure Father  . No Known Problems Sister  . Prostate cancer Brother  . No Known Problems Sister   Medications: Current Outpatient Prescriptions Ordered in Epic  Medication  Sig Dispense Refill  . CALCIUM CITRATE/VITAMIN D3 (CITRACAL + D ORAL) Take 1 tablet by mouth once daily.  . cholecalciferol (VITAMIN D3) 2,000 unit tablet Take 2,000 Units by mouth once daily.  . cyclobenzaprine (FLEXERIL) 10 MG tablet Take 5-10 mg by mouth every 8 (eight) hours as needed for Muscle spasms. Do not operate machinery  . gabapentin (NEURONTIN) 600 MG tablet TAKE ONE (1) TABLET THREE (3) TIMES EACH DAY 270 tablet 1  . MULTIVITS,CA,MINERALS/IRON/FA (ONE-A-DAY WOMENS FORMULA ORAL) Take 1 tablet by mouth once daily.  Marland Kitchen triamterene-hydrochlorothiazide (MAXZIDE-25) 37.5-25 mg tablet Take 1 tablet by mouth once daily. 90 tablet 3  . vitamin E 1000 UNIT capsule Take 1,000 Units by mouth once daily.   Current Facility-Administered Medications Ordered in Epic  Medication Dose Route Frequency Provider Last Rate Last Dose  . lidocaine (XYLOCAINE) 1 % injection 3 mL 3 mL Intra-articular Once Feliberto Gottron, [place]   Allergies: Allergies  Allergen Reactions  . Other Other (See Comments)  Gotalonium IV dye- hot with vomiting    Review of Systems:  A comprehensive 14 point ROS was performed, reviewed by me today, and the pertinent orthopaedic findings are documented in the HPI.  Exam: BP 128/74 (BP Location: Left upper arm, Patient Position: Sitting, BP Cuff Size: Adult)  Ht 162.6 cm (5\' 4" )  Wt 69.6 kg (153 lb 6.4 oz)  BMI 26.33 kg/m2  General General well developed, well nourished, no apparent distress. Normal affect. Normal gait with no antalgic component.  Left wrist: Examination of the left wrist shows the patient has a dorsal ganglion cyst, 3 cm in diameter with fluctuance. There is no warmth erythema or drainage. Patient has full composite fist with grip strength 5 out of 5. She has full range of motion of the wrist with mild discomfort along the dorsal ganglion cyst. Patient has tenderness on the dorsal ganglion cyst.  Imaging: AP lateral and oblique views of the left  wrist were ordered interpreted by me in the office today. Impression: Patient has no evidence of acute bony abnormality. There is noticeable soft tissue swelling along the dorsal aspect of the wrist.  Impression: Ganglion cyst of wrist, left [M67.432] Ganglion cyst of wrist, left (primary encounter diagnosis)  Plan:   failure of aspiration and injection she is being brought in for outpatient surgery for excision ganglion cyst.

## 2016-07-28 NOTE — Progress Notes (Signed)
Dressing clean and dry  No complaints of pain

## 2016-07-28 NOTE — Anesthesia Procedure Notes (Signed)
Procedure Name: LMA Insertion Date/Time: 07/28/2016 11:07 AM Performed by: Johnna Acosta Pre-anesthesia Checklist: Patient identified, Emergency Drugs available, Suction available, Patient being monitored and Timeout performed Patient Re-evaluated:Patient Re-evaluated prior to inductionOxygen Delivery Method: Circle system utilized Preoxygenation: Pre-oxygenation with 100% oxygen Intubation Type: IV induction LMA: LMA inserted LMA Size: 3.5 Tube type: Oral Number of attempts: 1 Placement Confirmation: positive ETCO2 and breath sounds checked- equal and bilateral Tube secured with: Tape Dental Injury: Teeth and Oropharynx as per pre-operative assessment

## 2016-07-28 NOTE — Transfer of Care (Signed)
Immediate Anesthesia Transfer of Care Note  Patient: Erica Stone  Procedure(s) Performed: Procedure(s): REMOVAL GANGLION OF WRIST (Left)  Patient Location: PACU  Anesthesia Type:General  Level of Consciousness: sedated  Airway & Oxygen Therapy: Patient Spontanous Breathing and Patient connected to face mask oxygen  Post-op Assessment: Report given to RN and Post -op Vital signs reviewed and stable  Post vital signs: Reviewed and stable  Last Vitals:  Vitals:   07/28/16 1014 07/28/16 1149  BP: 135/76   Pulse: 89 78  Resp: 20 17  Temp: 36.2 C     Last Pain:  Vitals:   07/28/16 1149  TempSrc: Tympanic         Complications: No apparent anesthesia complications

## 2016-07-28 NOTE — Discharge Instructions (Addendum)
Keep splint clean and dry. Work fingers as tolerated. Pain medicine as directed   AMBULATORY SURGERY  DISCHARGE INSTRUCTIONS   1) The drugs that you were given will stay in your system until tomorrow so for the next 24 hours you should not:  A) Drive an automobile B) Make any legal decisions C) Drink any alcoholic beverage   2) You may resume regular meals tomorrow.  Today it is better to start with liquids and gradually work up to solid foods.  You may eat anything you prefer, but it is better to start with liquids, then soup and crackers, and gradually work up to solid foods.   3) Please notify your doctor immediately if you have any unusual bleeding, trouble breathing, redness and pain at the surgery site, drainage, fever, or pain not relieved by medication.    4) Additional Instructions:        Please contact your physician with any problems or Same Day Surgery at [phone], Monday through Friday 6 am to 4 pm, or [place] at [facility] number at [phone].

## 2016-07-29 LAB — SURGICAL PATHOLOGY

## 2016-08-01 DIAGNOSIS — M67432 Ganglion, left wrist: Secondary | ICD-10-CM | POA: Diagnosis not present

## 2016-10-25 ENCOUNTER — Other Ambulatory Visit: Payer: Self-pay

## 2016-10-25 DIAGNOSIS — N281 Cyst of kidney, acquired: Secondary | ICD-10-CM

## 2016-12-08 DIAGNOSIS — Z79899 Other long term (current) drug therapy: Secondary | ICD-10-CM | POA: Diagnosis not present

## 2016-12-08 DIAGNOSIS — R1084 Generalized abdominal pain: Secondary | ICD-10-CM | POA: Diagnosis not present

## 2016-12-08 DIAGNOSIS — R1013 Epigastric pain: Secondary | ICD-10-CM | POA: Diagnosis not present

## 2016-12-08 DIAGNOSIS — R194 Change in bowel habit: Secondary | ICD-10-CM | POA: Diagnosis not present

## 2016-12-08 DIAGNOSIS — R7309 Other abnormal glucose: Secondary | ICD-10-CM | POA: Diagnosis not present

## 2016-12-08 DIAGNOSIS — R109 Unspecified abdominal pain: Secondary | ICD-10-CM | POA: Diagnosis not present

## 2016-12-08 DIAGNOSIS — J431 Panlobular emphysema: Secondary | ICD-10-CM | POA: Diagnosis not present

## 2016-12-08 DIAGNOSIS — I10 Essential (primary) hypertension: Secondary | ICD-10-CM | POA: Diagnosis not present

## 2016-12-08 DIAGNOSIS — Z8659 Personal history of other mental and behavioral disorders: Secondary | ICD-10-CM | POA: Diagnosis not present

## 2016-12-08 DIAGNOSIS — F3341 Major depressive disorder, recurrent, in partial remission: Secondary | ICD-10-CM | POA: Diagnosis not present

## 2016-12-08 DIAGNOSIS — I700 Atherosclerosis of aorta: Secondary | ICD-10-CM | POA: Diagnosis not present

## 2017-01-06 ENCOUNTER — Ambulatory Visit: Payer: Commercial Managed Care - HMO | Admitting: Urology

## 2017-02-01 DIAGNOSIS — K582 Mixed irritable bowel syndrome: Secondary | ICD-10-CM | POA: Diagnosis not present

## 2017-02-01 DIAGNOSIS — Z8371 Family history of colonic polyps: Secondary | ICD-10-CM | POA: Diagnosis not present

## 2017-02-01 DIAGNOSIS — K5790 Diverticulosis of intestine, part unspecified, without perforation or abscess without bleeding: Secondary | ICD-10-CM | POA: Diagnosis not present

## 2017-02-28 DIAGNOSIS — D125 Benign neoplasm of sigmoid colon: Secondary | ICD-10-CM | POA: Diagnosis not present

## 2017-02-28 DIAGNOSIS — K640 First degree hemorrhoids: Secondary | ICD-10-CM | POA: Diagnosis not present

## 2017-02-28 DIAGNOSIS — K635 Polyp of colon: Secondary | ICD-10-CM | POA: Diagnosis not present

## 2017-02-28 DIAGNOSIS — D126 Benign neoplasm of colon, unspecified: Secondary | ICD-10-CM | POA: Diagnosis not present

## 2017-02-28 DIAGNOSIS — K5730 Diverticulosis of large intestine without perforation or abscess without bleeding: Secondary | ICD-10-CM | POA: Diagnosis not present

## 2017-02-28 DIAGNOSIS — K648 Other hemorrhoids: Secondary | ICD-10-CM | POA: Diagnosis not present

## 2017-02-28 DIAGNOSIS — Z8371 Family history of colonic polyps: Secondary | ICD-10-CM | POA: Diagnosis not present

## 2017-02-28 DIAGNOSIS — Z1211 Encounter for screening for malignant neoplasm of colon: Secondary | ICD-10-CM | POA: Diagnosis not present

## 2017-03-03 IMAGING — CT CT ABD-PEL WO/W CM
2 of 10 series · 11 of 46 positions shown, 18 images · IV contrast (omnipaque)
Comparison: None.

CLINICAL DATA: Hematuria with burning pain in the left back for
several months. History of left renal mass, breast cancer and
hysterectomy. Initial encounter.

EXAM:
CT ABDOMEN AND PELVIS WITHOUT AND WITH CONTRAST
TECHNIQUE: Multidetector CT imaging of the abdomen and pelvis was performed
following the standard protocol before and following the bolus
administration of intravenous contrast.
CONTRAST:  125mL OMNIPAQUE IOHEXOL 300 MG/ML  SOLN

[Series 7: cor hematuria > 45 wo · coronal · 0.69mm/px · 2 of 136 slices shown, 3 images]
[im 46/136  soft-tissue]
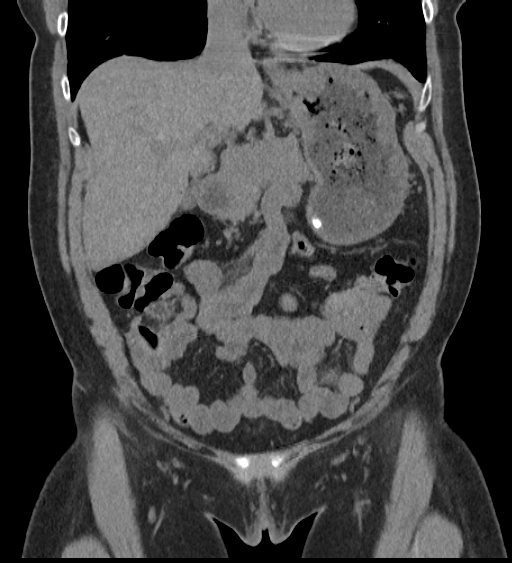
[im 46/136  bone]
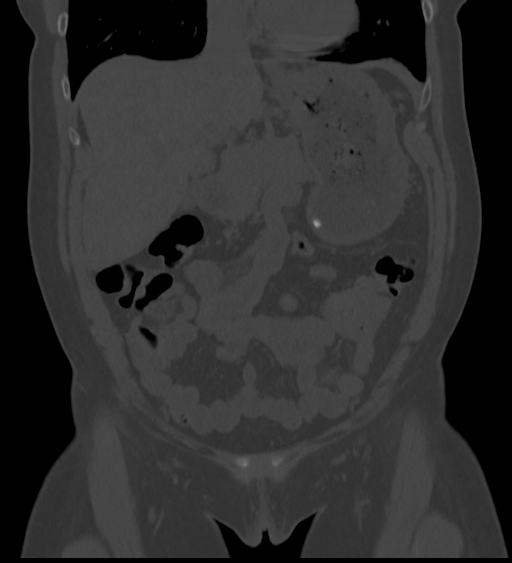
[im 91/136  soft-tissue]
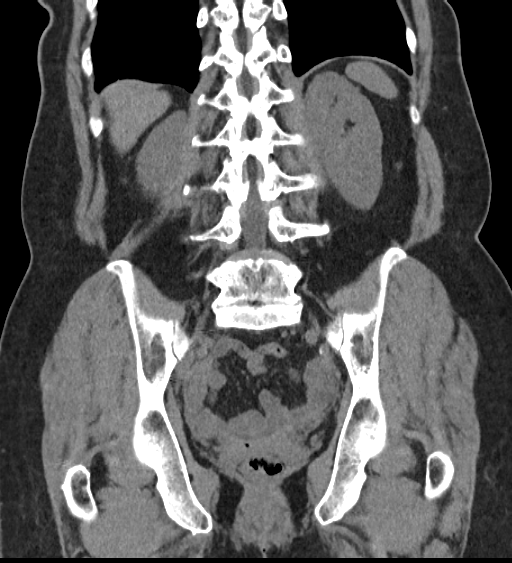

[Series 12: delay · axial · delayed · 0.70mm/px · z∈[-1020,-670]mm · 9 of 88 slices shown, 15 images]
[im 9/88  soft-tissue]
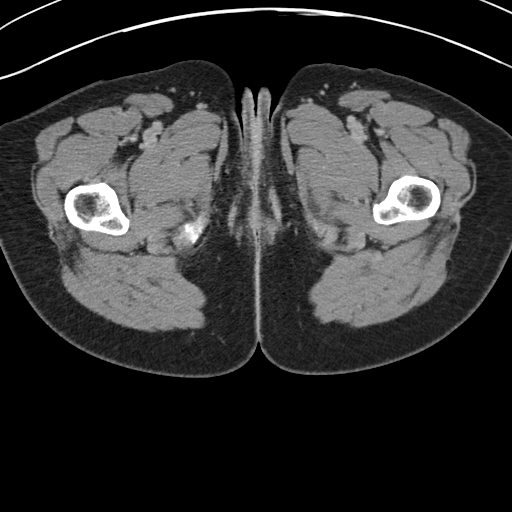
[im 9/88  bone]
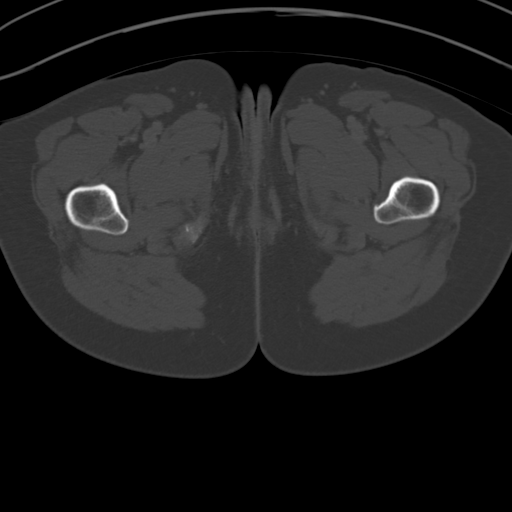
[im 18/88  soft-tissue]
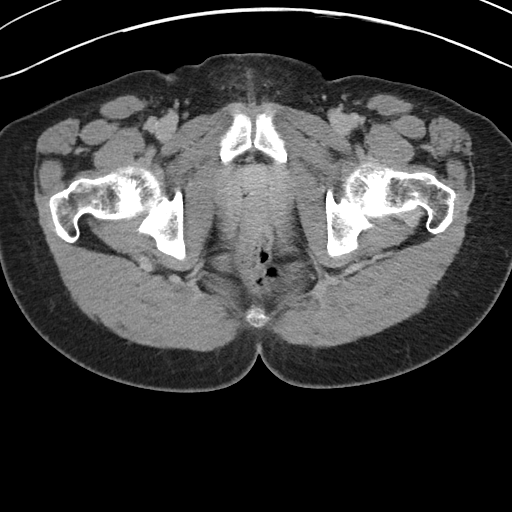
[im 27/88  soft-tissue]
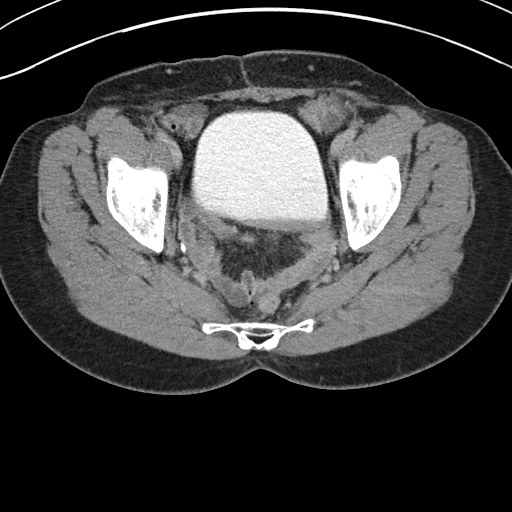
[im 35/88  soft-tissue]
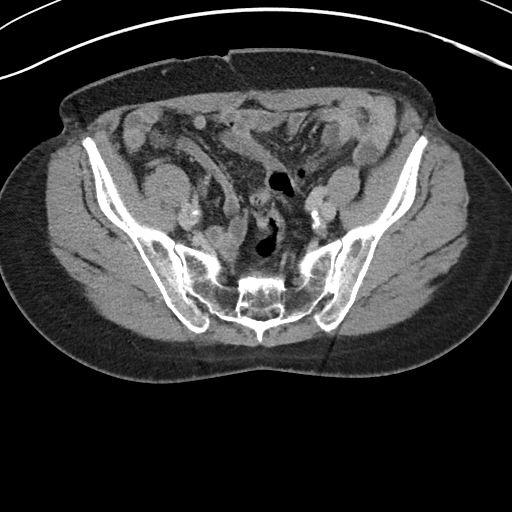
[im 44/88  soft-tissue]
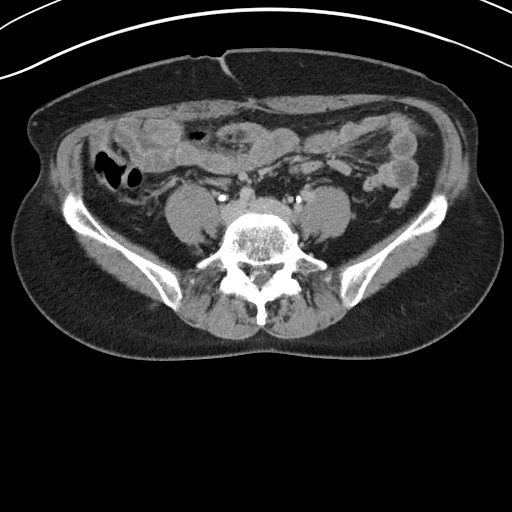
[im 53/88  soft-tissue]
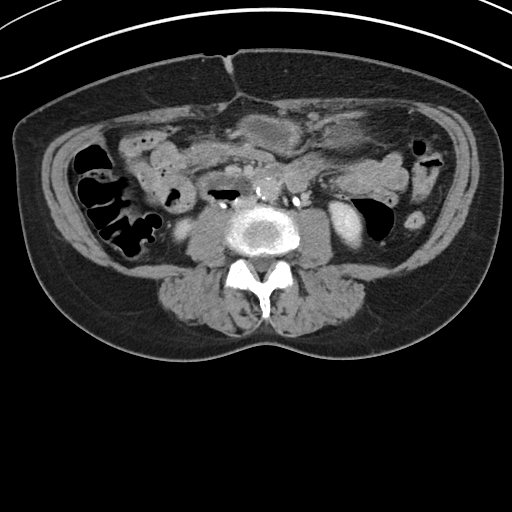
[im 53/88  lung]
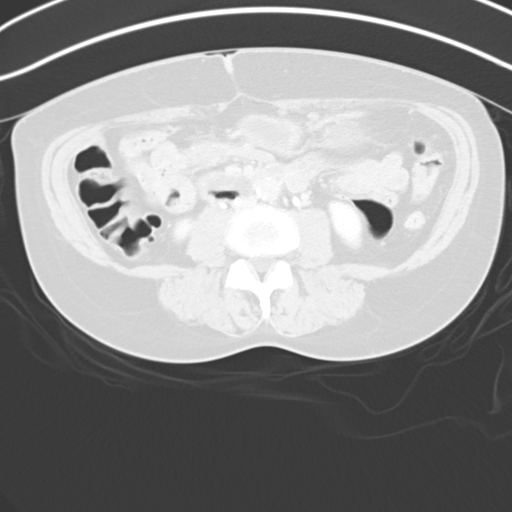
[im 61/88  soft-tissue]
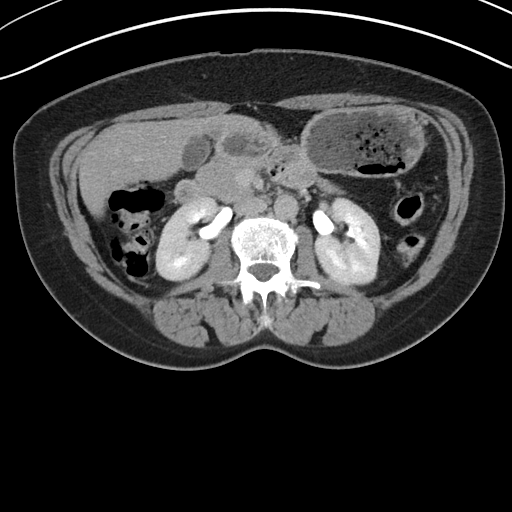
[im 61/88  lung]
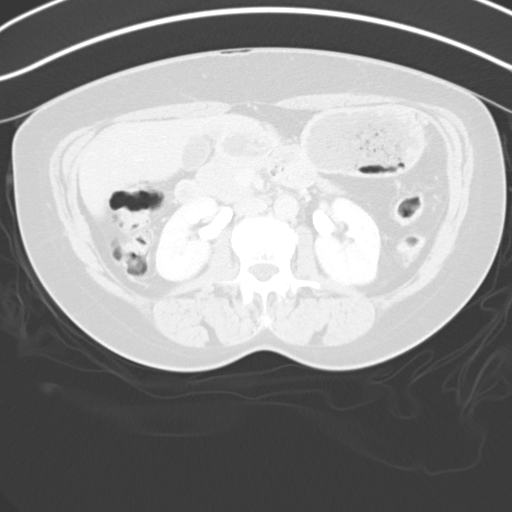
[im 70/88  soft-tissue]
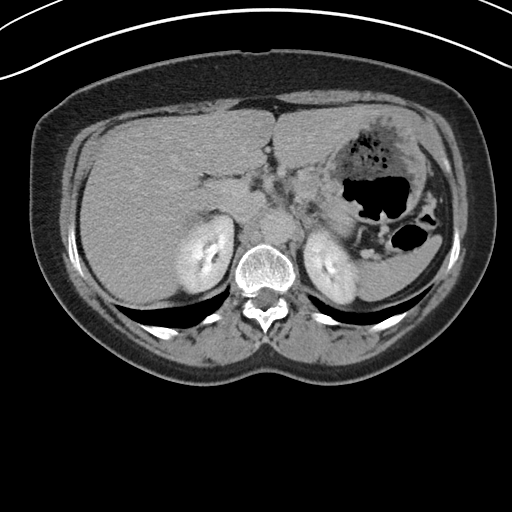
[im 70/88  lung]
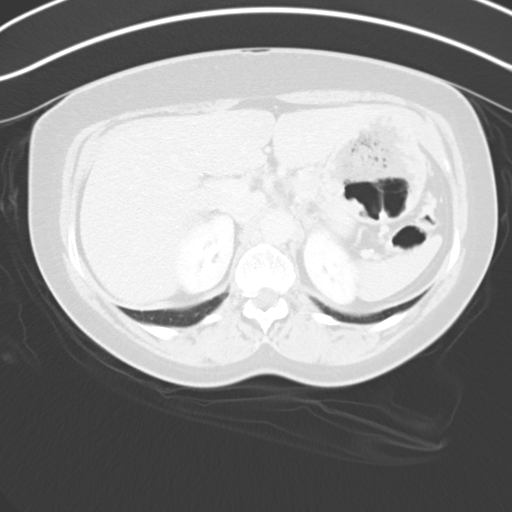
[im 79/88  soft-tissue]
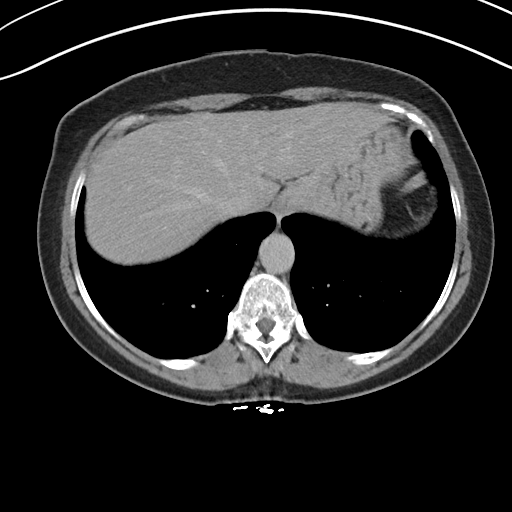
[im 79/88  lung]
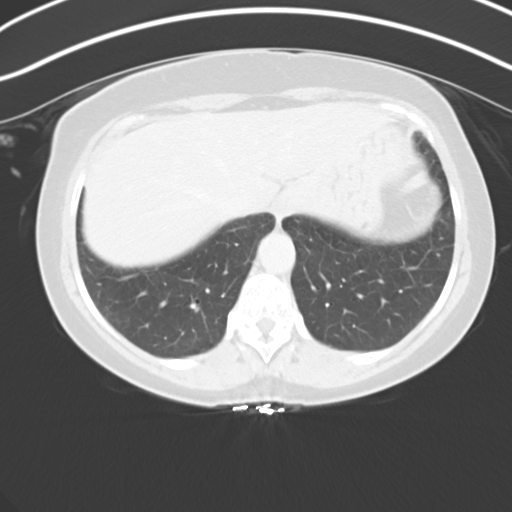
[im 79/88  bone]
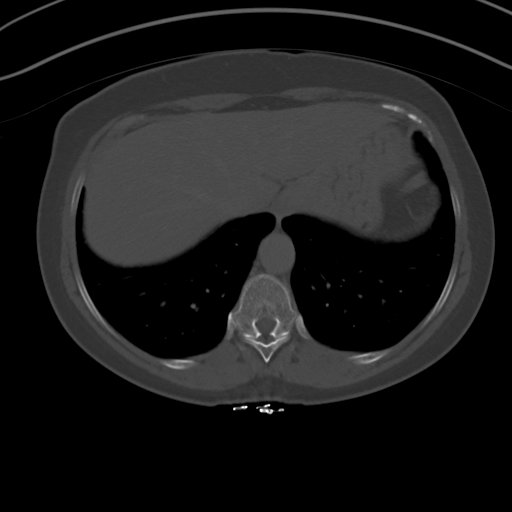

[11 of 46 positions shown; findings below may reference images not displayed]

FINDINGS: Lower chest: Clear lung bases. No significant pleural or pericardial
effusion.

Hepatobiliary: The liver is normal in density without focal
abnormality. No evidence of gallstones, gallbladder wall thickening
or biliary dilatation.

Pancreas: Unremarkable. No pancreatic ductal dilatation or
surrounding inflammatory changes.

Spleen: Normal in size without focal abnormality.

Adrenals/Urinary Tract: Both adrenal glands appear normal.
Pre-contrast images demonstrate no renal, ureteral or bladder
calculi. Post-contrast, both kidneys enhance normally. There is an
indeterminate hyperdense lesion posteriorly in the upper interpolar
region of the left kidney which is best seen on the post-contrast
images. Prior to contrast administration, this measures between 35
and 41 HU. On the immediate postcontrast images, this measures 67
HU, and on the delayed post-contrast images, it measures 72 HU. The
lesion measures 11 mm in diameter. No other focal renal
abnormalities identified. Delayed images result in segmental
visualization of the ureters. No urothelial abnormalities are
identified. The bladder appears unremarkable.

Stomach/Bowel: No evidence of bowel wall thickening, distention or
surrounding inflammatory change. Mild colonic diverticulosis. The
appendix appears normal.

Vascular/Lymphatic: There are no enlarged abdominal or pelvic lymph
nodes. Mild aortoiliac atherosclerosis.

Reproductive: Hysterectomy.  No evidence of adnexal mass.

Other: Postsurgical changes within the anterior abdominal wall.
Small umbilical hernia containing only fat.

Musculoskeletal: No acute or significant osseous findings.
Degenerative disc disease at L5-S1.
IMPRESSION: 1. Indeterminate 11 mm left renal lesion demonstrating probable
low-level enhancement versus pseudo enhancement. Appearance is
concerning for possible papillary renal cell carcinoma (versus a
hemorrhagic cyst). This would be better characterized with renal
MRI.
2. No evidence of urinary tract calculus or hydronephrosis.
3. Mild atherosclerosis and colonic diverticulosis noted.

## 2017-05-29 ENCOUNTER — Other Ambulatory Visit: Payer: Self-pay | Admitting: Nurse Practitioner

## 2017-05-29 DIAGNOSIS — K582 Mixed irritable bowel syndrome: Secondary | ICD-10-CM | POA: Diagnosis not present

## 2017-05-29 DIAGNOSIS — R142 Eructation: Secondary | ICD-10-CM | POA: Diagnosis not present

## 2017-05-29 DIAGNOSIS — R109 Unspecified abdominal pain: Secondary | ICD-10-CM | POA: Diagnosis not present

## 2017-05-29 DIAGNOSIS — N2889 Other specified disorders of kidney and ureter: Secondary | ICD-10-CM

## 2017-05-31 ENCOUNTER — Ambulatory Visit
Admission: RE | Admit: 2017-05-31 | Discharge: 2017-05-31 | Disposition: A | Discharge status: Home / Self Care | Payer: Medicare HMO | Source: Ambulatory Visit | Attending: Nurse Practitioner | Admitting: Nurse Practitioner

## 2017-05-31 DIAGNOSIS — N2889 Other specified disorders of kidney and ureter: Secondary | ICD-10-CM | POA: Diagnosis not present

## 2017-06-08 ENCOUNTER — Ambulatory Visit (INDEPENDENT_AMBULATORY_CARE_PROVIDER_SITE_OTHER): Payer: Medicare HMO | Admitting: Urology

## 2017-06-08 ENCOUNTER — Encounter: Payer: Self-pay | Admitting: Urology

## 2017-06-08 VITALS — BP 113/70 | HR 78 | Ht 66.0 in | Wt 155.5 lb

## 2017-06-08 DIAGNOSIS — N281 Cyst of kidney, acquired: Secondary | ICD-10-CM

## 2017-06-08 LAB — URINALYSIS, COMPLETE
Bilirubin, UA: NEGATIVE
Glucose, UA: NEGATIVE
Ketones, UA: NEGATIVE
LEUKOCYTES UA: NEGATIVE
NITRITE UA: NEGATIVE
PH UA: 6 (ref 5.0–7.5)
PROTEIN UA: NEGATIVE
RBC, UA: NEGATIVE
Specific Gravity, UA: 1.02 (ref 1.005–1.030)
Urobilinogen, Ur: 0.2 mg/dL (ref 0.2–1.0)

## 2017-06-08 NOTE — Progress Notes (Signed)
06/08/2017 9:55 AM   Erica Stone [birth year] [id number]  Referring provider: Idelle Crouch, MD [facility] [place], [place] [address]  Chief Complaint  Patient presents with  . Follow-up    Renal cyst    HPI: The patient is a 56 year old female with a history of gross hematuria and a left renal mass. Her hematuria workup was negative in September 2016. She does have 11 mm indeterminate renal mass that dates back to at least January 2016. Ideally, she would've undergone MRI but she does not tolerate gadolinium contrast. Repeat CT scan in March 2017 again shows this indeterminant renal mass that is stable in size. A renal ultrasound in May 2018 the upper pole left renal mass was measured at 1.5 cm. (There was a transcription error in the impression portion of the radiologist reported that erroneously said that it was 1.9 cm.)   PMH: Past Medical History:  Diagnosis Date  . Bipolar disorder ([place])   . DDD (degenerative disc disease), cervical   . Depression   . Hypertension   . Lobular carcinoma of breast ([place]) 2006   lumpectomy- Rt  . Neuropathy   . Personal history of chemotherapy   . Personal history of radiation therapy   . Renal mass   . Urinary frequency     Surgical History: Past Surgical History:  Procedure Laterality Date  . ABDOMINAL HYSTERECTOMY  1992   fibroid- PJR  . BREAST BIOPSY Right 2006   +  . BREAST LUMPECTOMY     sentinel node biopsy  . CERVICAL SPINE SURGERY  2011  . GANGLION CYST EXCISION Left 07/28/2016   Procedure: REMOVAL GANGLION OF WRIST;  Surgeon: Hessie Knows, MD;  Location: [facility] ORS;  Service: Orthopedics;  Laterality: Left;  . PARTIAL HYSTERECTOMY    . TOE SURGERY Left    4th & 5th  . WRIST SURGERY Left    ganglion with reoccurence    Home Medications:  Allergies as of 06/08/2017      Reactions   Gadolinium Derivatives    Other reaction(s): Vomiting   Other    Other reaction(s): Other (See  Comments) Gotalonium IV dye- hot with vomiting      Medication List       Accurate as of 06/08/17  9:55 AM. Always use your most recent med list.          Calcium Citrate-Vitamin D3 1000-400 Liqd Take by mouth.   cyclobenzaprine 10 MG tablet Commonly known as:  FLEXERIL Take 10 mg by mouth 3 (three) times daily as needed.   D 2000 2000 units Tabs Generic drug:  Cholecalciferol Take by mouth. Reported on 12/24/2015   gabapentin 600 MG tablet Commonly known as:  NEURONTIN Take 600 mg by mouth 3 (three) times daily.   HYDROcodone-acetaminophen 5-325 MG tablet Commonly known as:  NORCO Take 1 tablet by mouth every 6 (six) hours as needed for moderate pain.   ONE-A-DAY WOMENS FORMULA PO Take 1 tablet by mouth daily.   triamterene-hydrochlorothiazide 37.5-25 MG tablet Commonly known as:  MAXZIDE-25 TAKE ONE (1) TABLET EACH DAY            Discharge Care Instructions        Start     Ordered   06/08/17 0000  Urinalysis, Complete     06/08/17 9509   06/08/17 0000  CT ABDOMEN PELVIS W WO CONTRAST    Comments:  This exam should ONLY be ordered for initial diagnosis or follow  up of known pancreatic/liver/renal/bladder masses.  Question Answer Comment  If indicated for the ordered procedure, I authorize the administration of contrast media per Radiology protocol Yes   Is patient pregnant? No   Preferred imaging location? [facility]   Radiology Contrast Protocol - do NOT remove file path [email or web address]      06/08/17 0954      Allergies:  Allergies  Allergen Reactions  . Gadolinium Derivatives     Other reaction(s): Vomiting  . Other     Other reaction(s): Other (See Comments) Gotalonium IV dye- hot with vomiting    Family History: Family History  Problem Relation Age of Onset  . Prostate cancer Father   . Prostate cancer Brother   . Diabetes Mother   . Breast cancer Neg Hx   . Ovarian cancer Neg Hx     Social  History:  reports that she has been smoking Cigarettes.  She has a 18.00 pack-year smoking history. She has never used smokeless tobacco. She reports that she drinks alcohol. She reports that she does not use drugs.  ROS: UROLOGY Frequent Urination?: Yes Hard to postpone urination?: Yes Burning/pain with urination?: No Get up at night to urinate?: Yes Leakage of urine?: No Urine stream starts and stops?: Yes Trouble starting stream?: Yes Do you have to strain to urinate?: No Blood in urine?: No Urinary tract infection?: No Sexually transmitted disease?: No Injury to kidneys or bladder?: No Painful intercourse?: No Weak stream?: No Currently pregnant?: No Vaginal bleeding?: No Last menstrual period?: n  Gastrointestinal Nausea?: Yes Vomiting?: No Indigestion/heartburn?: Yes Diarrhea?: Yes Constipation?: Yes  Constitutional Fever: No Night sweats?: Yes Weight loss?: No Fatigue?: Yes  Skin Skin rash/lesions?: No Itching?: No  Eyes Blurred vision?: Yes Double vision?: No  Ears/Nose/Throat Sore throat?: No Sinus problems?: Yes  Hematologic/Lymphatic Swollen glands?: No Easy bruising?: No  Cardiovascular Leg swelling?: No Chest pain?: No  Respiratory Cough?: No Shortness of breath?: Yes  Endocrine Excessive thirst?: No  Musculoskeletal Back pain?: Yes Joint pain?: Yes  Neurological Headaches?: Yes Dizziness?: No  Psychologic Depression?: No Anxiety?: No  Physical Exam: BP 113/70 (BP Location: Left Arm, Patient Position: Sitting, Cuff Size: Normal)   Pulse 78   Ht 5\' 6"  (1.676 m)   Wt 155 lb 8 oz (70.5 kg)   BMI 25.10 kg/m   Constitutional:  Alert and oriented, No acute distress. HEENT: [place] AT, moist mucus membranes.  Trachea midline, no masses. Cardiovascular: No clubbing, cyanosis, or edema. Respiratory: Normal respiratory effort, no increased work of breathing. GI: Abdomen is soft, nontender, nondistended, no abdominal masses GU: No CVA  tenderness.  Skin: No rashes, bruises or suspicious lesions. Lymph: No cervical or inguinal adenopathy. Neurologic: Grossly intact, no focal deficits, moving all 4 extremities. Psychiatric: Normal mood and affect.  Laboratory Data: No results found for: WBC, HGB, HCT, MCV, PLT  Lab Results  Component Value Date   CREATININE 0.83 05/25/2015    No results found for: PSA  No results found for: TESTOSTERONE  No results found for: HGBA1C  Urinalysis    Component Value Date/Time   APPEARANCEUR Clear 12/24/2015 0959   GLUCOSEU Negative 12/24/2015 0959   BILIRUBINUR Negative 12/24/2015 0959   PROTEINUR Negative 12/24/2015 0959   UROBILINOGEN 0.2 12/03/2015 0834   NITRITE Negative 12/24/2015 0959   LEUKOCYTESUR Negative 12/24/2015 0959    Pertinent Imaging: Renal ultrasound previous CT reviewed as above.  Assessment & Plan:    1. Left renal mass I discussed with the patient  that her mass is relatively stable in size. His 1.5 cm on ultrasound which is consistent with a 1.2 cm seen on a CT from over one year ago given variances in measuring capabilities between the 2 imaging modalities. I did also discuss with her that the impression portion of her radiology report erroneously reports that the mass is 1.9 cm. However, in reviewing both the imaging and the body portion of the radiology report the correct size is 1.5 cm. At this point, I do not think she needs any further intervention other than continued active surveillance due to relative stability in size. She'll follow-up in 6 months with a CT renal mass protocol prior.   Return in about 6 months (around 12/09/2017) for CT prior.  Nickie Retort, MD  [facility] [address] [place], [place] [address] [phone]

## 2017-06-22 DIAGNOSIS — Z79899 Other long term (current) drug therapy: Secondary | ICD-10-CM | POA: Diagnosis not present

## 2017-06-22 DIAGNOSIS — I10 Essential (primary) hypertension: Secondary | ICD-10-CM | POA: Diagnosis not present

## 2017-06-22 DIAGNOSIS — Z13220 Encounter for screening for lipoid disorders: Secondary | ICD-10-CM | POA: Diagnosis not present

## 2017-06-22 DIAGNOSIS — Z0000 Encounter for general adult medical examination without abnormal findings: Secondary | ICD-10-CM | POA: Diagnosis not present

## 2017-06-22 DIAGNOSIS — K582 Mixed irritable bowel syndrome: Secondary | ICD-10-CM | POA: Diagnosis not present

## 2017-06-22 DIAGNOSIS — N2889 Other specified disorders of kidney and ureter: Secondary | ICD-10-CM | POA: Diagnosis not present

## 2017-06-22 DIAGNOSIS — R7309 Other abnormal glucose: Secondary | ICD-10-CM | POA: Diagnosis not present

## 2017-06-22 DIAGNOSIS — M5030 Other cervical disc degeneration, unspecified cervical region: Secondary | ICD-10-CM | POA: Diagnosis not present

## 2017-06-22 DIAGNOSIS — C50911 Malignant neoplasm of unspecified site of right female breast: Secondary | ICD-10-CM | POA: Diagnosis not present

## 2017-07-11 DIAGNOSIS — D126 Benign neoplasm of colon, unspecified: Secondary | ICD-10-CM | POA: Diagnosis not present

## 2017-07-11 DIAGNOSIS — R1012 Left upper quadrant pain: Secondary | ICD-10-CM | POA: Diagnosis not present

## 2017-07-11 DIAGNOSIS — K2960 Other gastritis without bleeding: Secondary | ICD-10-CM | POA: Diagnosis not present

## 2017-07-11 DIAGNOSIS — K2950 Unspecified chronic gastritis without bleeding: Secondary | ICD-10-CM | POA: Diagnosis not present

## 2017-07-11 DIAGNOSIS — R1032 Left lower quadrant pain: Secondary | ICD-10-CM | POA: Diagnosis not present

## 2017-07-11 DIAGNOSIS — K219 Gastro-esophageal reflux disease without esophagitis: Secondary | ICD-10-CM | POA: Diagnosis not present

## 2017-07-11 DIAGNOSIS — K210 Gastro-esophageal reflux disease with esophagitis: Secondary | ICD-10-CM | POA: Diagnosis not present

## 2017-07-11 DIAGNOSIS — K3189 Other diseases of stomach and duodenum: Secondary | ICD-10-CM | POA: Diagnosis not present

## 2017-10-20 DIAGNOSIS — F3341 Major depressive disorder, recurrent, in partial remission: Secondary | ICD-10-CM | POA: Diagnosis not present

## 2017-10-20 DIAGNOSIS — M79641 Pain in right hand: Secondary | ICD-10-CM | POA: Diagnosis not present

## 2017-10-20 DIAGNOSIS — M79642 Pain in left hand: Secondary | ICD-10-CM | POA: Diagnosis not present

## 2017-10-20 DIAGNOSIS — M25561 Pain in right knee: Secondary | ICD-10-CM | POA: Diagnosis not present

## 2017-10-20 DIAGNOSIS — N6310 Unspecified lump in the right breast, unspecified quadrant: Secondary | ICD-10-CM | POA: Diagnosis not present

## 2017-10-23 DIAGNOSIS — B078 Other viral warts: Secondary | ICD-10-CM | POA: Diagnosis not present

## 2017-10-23 DIAGNOSIS — Z853 Personal history of malignant neoplasm of breast: Secondary | ICD-10-CM | POA: Diagnosis not present

## 2017-10-23 DIAGNOSIS — N63 Unspecified lump in unspecified breast: Secondary | ICD-10-CM | POA: Diagnosis not present

## 2017-10-23 DIAGNOSIS — N649 Disorder of breast, unspecified: Secondary | ICD-10-CM | POA: Diagnosis not present

## 2017-11-06 ENCOUNTER — Other Ambulatory Visit: Payer: Self-pay | Admitting: Surgery

## 2017-11-06 DIAGNOSIS — B078 Other viral warts: Secondary | ICD-10-CM | POA: Diagnosis not present

## 2017-11-06 DIAGNOSIS — N63 Unspecified lump in unspecified breast: Secondary | ICD-10-CM

## 2017-11-09 ENCOUNTER — Ambulatory Visit
Admission: RE | Admit: 2017-11-09 | Discharge: 2017-11-09 | Disposition: A | Discharge status: Home / Self Care | Payer: Medicare HMO | Source: Ambulatory Visit | Attending: Surgery | Admitting: Surgery

## 2017-11-09 DIAGNOSIS — N63 Unspecified lump in unspecified breast: Secondary | ICD-10-CM

## 2017-11-09 DIAGNOSIS — R922 Inconclusive mammogram: Secondary | ICD-10-CM | POA: Diagnosis not present

## 2017-11-09 DIAGNOSIS — R921 Mammographic calcification found on diagnostic imaging of breast: Secondary | ICD-10-CM | POA: Insufficient documentation

## 2017-11-09 DIAGNOSIS — N6310 Unspecified lump in the right breast, unspecified quadrant: Secondary | ICD-10-CM | POA: Diagnosis not present

## 2017-11-22 ENCOUNTER — Ambulatory Visit: Payer: Medicare HMO | Admitting: Obstetrics and Gynecology

## 2017-11-22 ENCOUNTER — Encounter: Payer: Self-pay | Admitting: Obstetrics and Gynecology

## 2017-11-22 VITALS — BP 134/80 | HR 90 | Ht 66.0 in | Wt 158.8 lb

## 2017-11-22 DIAGNOSIS — R300 Dysuria: Secondary | ICD-10-CM

## 2017-11-22 DIAGNOSIS — N952 Postmenopausal atrophic vaginitis: Secondary | ICD-10-CM | POA: Diagnosis not present

## 2017-11-22 DIAGNOSIS — N898 Other specified noninflammatory disorders of vagina: Secondary | ICD-10-CM

## 2017-11-22 LAB — POCT URINALYSIS DIPSTICK
Bilirubin, UA: NEGATIVE
Blood, UA: NEGATIVE
Glucose, UA: NEGATIVE
Ketones, UA: NEGATIVE
Leukocytes, UA: NEGATIVE
NITRITE UA: NEGATIVE
ODOR: NEGATIVE
PH UA: 7 (ref 5.0–8.0)
PROTEIN UA: NEGATIVE
Spec Grav, UA: <=1.005 — AB (ref 1.010–1.025)
UROBILINOGEN UA: 0.2 U/dL

## 2017-11-22 NOTE — Progress Notes (Signed)
Chief complaint: 1.  Vaginal discharge  GYN ENCOUNTER NOTE  Subjective:       Erica Stone is a 57 y.o. G44P1001 female is here for gynecologic evaluation of the following issues:  1.  Vaginal discharge  57 year old female status post hysterectomy, history of breast cancer, presents for evaluation of atypical vaginal discharge that developed recently. She has new onset relations with a partner, and has noted a thick scaly type skin discharge coming from around her clitoris when wiping. She denies fevers chills or sweats.  She denies UTI symptoms.  Bowel function is normal.  She denies any vulvovaginal itching.   Gynecologic History No LMP recorded. Patient has had a hysterectomy. Contraception: status post hysterectomy  Obstetric History OB History  Gravida Para Term Preterm AB Living  1 1 1     1   SAB TAB Ectopic Multiple Live Births          1    # Outcome Date GA Lbr Len/2nd Weight Sex Delivery Anes PTL Lv  1 Term 1980   6 lb 1.6 oz (2.767 kg) F Vag-Spont   LIV      Past Medical History:  Diagnosis Date  . Bipolar disorder ([place])   . DDD (degenerative disc disease), cervical   . Depression   . Hypertension   . Lobular carcinoma of breast ([place]) 2006   lumpectomy- Rt  . Neuropathy   . Personal history of chemotherapy   . Personal history of radiation therapy 2006/2007   RIGHT lumpectomy  . Renal mass   . Urinary frequency     Past Surgical History:  Procedure Laterality Date  . ABDOMINAL HYSTERECTOMY  1992   fibroid- PJR  . BREAST BIOPSY Right 2006   +  . BREAST LUMPECTOMY Right 2006   lumpectomy w/ radiation and chemo  . CERVICAL SPINE SURGERY  2011  . GANGLION CYST EXCISION Left 07/28/2016   Procedure: REMOVAL GANGLION OF WRIST;  Surgeon: Hessie Knows, MD;  Location: [facility] ORS;  Service: Orthopedics;  Laterality: Left;  . PARTIAL HYSTERECTOMY    . TOE SURGERY Left    4th & 5th  . WRIST SURGERY Left    ganglion with reoccurence    Current Outpatient  Medications on File Prior to Visit  Medication Sig Dispense Refill  . Calcium Citrate-Vitamin D3 1000-400 LIQD Take by mouth.    . Cholecalciferol (D 2000) 2000 UNITS TABS Take by mouth. Reported on 12/24/2015    . cyclobenzaprine (FLEXERIL) 10 MG tablet Take 10 mg by mouth 3 (three) times daily as needed.     . gabapentin (NEURONTIN) 600 MG tablet Take 600 mg by mouth 3 (three) times daily.     . Garlic 939 MG TABS Take by mouth.    . MELOXICAM PO Take by mouth.    . Multiple Vitamins-Calcium (ONE-A-DAY WOMENS FORMULA PO) Take 1 tablet by mouth daily.    Marland Kitchen triamterene-hydrochlorothiazide (MAXZIDE-25) 37.5-25 MG per tablet TAKE ONE (1) TABLET EACH DAY     No current facility-administered medications on file prior to visit.     Allergies  Allergen Reactions  . Gadolinium Derivatives     Other reaction(s): Vomiting  . Other     Other reaction(s): Other (See Comments) Gotalonium IV dye- hot with vomiting    Social History   Socioeconomic History  . Marital status: Divorced    Spouse name: Not on file  . Number of children: Not on file  . Years of education: Not on file  .  Highest education level: Not on file  Social Needs  . Financial resource strain: Not on file  . Food insecurity - worry: Not on file  . Food insecurity - inability: Not on file  . Transportation needs - medical: Not on file  . Transportation needs - non-medical: Not on file  Occupational History  . Not on file  Tobacco Use  . Smoking status: Current Every Day Smoker    Packs/day: 0.50    Years: 36.00    Pack years: 18.00    Types: Cigarettes  . Smokeless tobacco: Never Used  Substance and Sexual Activity  . Alcohol use: Yes    Alcohol/week: 0.0 oz    Comment: 3 x a week  . Drug use: No  . Sexual activity: Yes  Other Topics Concern  . Not on file  Social History Narrative  . Not on file    Family History  Problem Relation Age of Onset  . Prostate cancer Father   . Prostate cancer Brother   .  Diabetes Mother   . Breast cancer Daughter 17  . Ovarian cancer Neg Hx     The following portions of the patient's history were reviewed and updated as appropriate: allergies, current medications, past family history, past medical history, past social history, past surgical history and problem list.  Review of Systems Comprehensive review of systems is negative except for that noted in the HPI Objective:   BP 134/80   Pulse 90   Ht 5\' 6"  (1.676 m)   Wt 158 lb 12.8 oz (72 kg)   BMI 25.63 kg/m  CONSTITUTIONAL: Well-developed, well-nourished female in no acute distress.  HENT:  Normocephalic, atraumatic.  NECK: Normal range of motion, supple, no masses.  Normal thyroid.  SKIN: Skin is warm and dry. No rash noted. Not diaphoretic. No erythema. No pallor. [place]: Alert and oriented to person, place, and time. PSYCHIATRIC: Normal mood and affect. Normal behavior. Normal judgment and thought content. CARDIOVASCULAR:Not Examined RESPIRATORY: Not Examined BREASTS: Not Examined ABDOMEN: Soft, non distended; Non tender.  No Organomegaly. PELVIC:  External Genitalia: Normal labia majora, labia minora, clitoris, introitus  BUS: Normal  Vagina: Mild to moderate atrophy; no discharge; palpation of anterior vagina causes minimal leakage of urine from the urethral meatus  Cervix: Surgically absent  Uterus: Surgically absent  Adnexa: No palpable masses or tenderness  RV: Normal external exam  Bladder: Nontender MUSCULOSKELETAL: Normal range of motion. No tenderness.  No cyanosis, clubbing, or edema.   PROCEDURE: Wet prep Normal saline-normal epithelial cells; no trichomonas; no clue cells KOH-no yeast  Assessment:   1. Dysuria - Urine Culture - POCT urinalysis dipstick  2. Vaginal discharge, unclear etiology  3. Vaginal atrophy     Plan:   1.  Wet prep-negative 2.  Urinalysis-negative 3.  Urine culture 4.  Monitor symptomatology and return if atypical vaginal discharge  persists or worsens 5.  Consider over-the-counter lubricants for vaginal dryness 6.  Follow-up as needed  A total of 15 minutes were spent face-to-face with the patient during this encounter and over half of that time dealt with counseling and coordination of care.  Brayton Mars, MD  Note: This dictation was prepared with Dragon dictation along with smaller phrase technology. Any transcriptional errors that result from this process are unintentional.

## 2017-11-22 NOTE — Patient Instructions (Signed)
1.  Urinalysis looks normal today 2.  Urine culture was sent for evaluation to rule out bladder infection 3.  Wet prep analysis of the vaginal secretions was normal 4.  Monitor symptoms and return if discharge increases or if itching or burning develop 5.  Follow-up as needed 6.  Lubricants for vaginal dryness include:  Virgin olive oil  Coconut oil  Astroglide  Jo H2O lubricant   Replens

## 2017-11-24 LAB — URINE CULTURE

## 2017-12-08 ENCOUNTER — Ambulatory Visit: Payer: Medicare HMO

## 2018-01-18 DIAGNOSIS — Z79899 Other long term (current) drug therapy: Secondary | ICD-10-CM | POA: Diagnosis not present

## 2018-01-18 DIAGNOSIS — E7800 Pure hypercholesterolemia, unspecified: Secondary | ICD-10-CM | POA: Diagnosis not present

## 2018-01-18 DIAGNOSIS — I10 Essential (primary) hypertension: Secondary | ICD-10-CM | POA: Diagnosis not present

## 2018-01-18 DIAGNOSIS — K582 Mixed irritable bowel syndrome: Secondary | ICD-10-CM | POA: Diagnosis not present

## 2018-01-18 DIAGNOSIS — R7309 Other abnormal glucose: Secondary | ICD-10-CM | POA: Diagnosis not present

## 2018-01-18 DIAGNOSIS — G629 Polyneuropathy, unspecified: Secondary | ICD-10-CM | POA: Diagnosis not present

## 2018-07-19 DIAGNOSIS — Z79899 Other long term (current) drug therapy: Secondary | ICD-10-CM | POA: Diagnosis not present

## 2018-07-19 DIAGNOSIS — Z1239 Encounter for other screening for malignant neoplasm of breast: Secondary | ICD-10-CM | POA: Diagnosis not present

## 2018-07-19 DIAGNOSIS — I10 Essential (primary) hypertension: Secondary | ICD-10-CM | POA: Diagnosis not present

## 2018-07-19 DIAGNOSIS — R7309 Other abnormal glucose: Secondary | ICD-10-CM | POA: Diagnosis not present

## 2018-12-04 ENCOUNTER — Other Ambulatory Visit (HOSPITAL_COMMUNITY): Payer: Self-pay | Admitting: Otolaryngology

## 2018-12-04 ENCOUNTER — Other Ambulatory Visit: Payer: Self-pay | Admitting: Otolaryngology

## 2018-12-04 DIAGNOSIS — R221 Localized swelling, mass and lump, neck: Secondary | ICD-10-CM

## 2018-12-04 DIAGNOSIS — R591 Generalized enlarged lymph nodes: Secondary | ICD-10-CM

## 2018-12-11 ENCOUNTER — Ambulatory Visit
Admission: RE | Admit: 2018-12-11 | Discharge: 2018-12-11 | Disposition: A | Discharge status: Home / Self Care | Payer: Medicare HMO | Source: Ambulatory Visit | Attending: Otolaryngology | Admitting: Otolaryngology

## 2018-12-11 DIAGNOSIS — R591 Generalized enlarged lymph nodes: Secondary | ICD-10-CM | POA: Diagnosis present

## 2018-12-11 DIAGNOSIS — R221 Localized swelling, mass and lump, neck: Secondary | ICD-10-CM | POA: Diagnosis present

## 2018-12-11 LAB — POCT I-STAT CREATININE: Creatinine, Ser: 0.9 mg/dL (ref 0.44–1.00)

## 2018-12-11 MED ORDER — IOHEXOL 300 MG/ML  SOLN
75.0000 mL | Freq: Once | INTRAMUSCULAR | Status: AC | PRN
  Administered 2018-12-11: 75 mL via INTRAVENOUS

## 2018-12-25 ENCOUNTER — Other Ambulatory Visit: Payer: Self-pay | Admitting: Otolaryngology

## 2018-12-25 DIAGNOSIS — R221 Localized swelling, mass and lump, neck: Secondary | ICD-10-CM

## 2018-12-31 ENCOUNTER — Other Ambulatory Visit: Payer: Self-pay | Admitting: Radiology

## 2019-01-01 ENCOUNTER — Other Ambulatory Visit: Payer: Self-pay

## 2019-01-01 ENCOUNTER — Ambulatory Visit
Admission: RE | Admit: 2019-01-01 | Discharge: 2019-01-01 | Disposition: A | Discharge status: Home / Self Care | Payer: Medicare HMO | Source: Ambulatory Visit | Attending: Otolaryngology | Admitting: Otolaryngology

## 2019-01-01 DIAGNOSIS — Z853 Personal history of malignant neoplasm of breast: Secondary | ICD-10-CM | POA: Diagnosis not present

## 2019-01-01 DIAGNOSIS — K118 Other diseases of salivary glands: Secondary | ICD-10-CM | POA: Insufficient documentation

## 2019-01-01 DIAGNOSIS — F17200 Nicotine dependence, unspecified, uncomplicated: Secondary | ICD-10-CM | POA: Insufficient documentation

## 2019-01-01 DIAGNOSIS — R221 Localized swelling, mass and lump, neck: Secondary | ICD-10-CM | POA: Insufficient documentation

## 2019-01-01 DIAGNOSIS — Z79899 Other long term (current) drug therapy: Secondary | ICD-10-CM | POA: Insufficient documentation

## 2019-01-01 DIAGNOSIS — I10 Essential (primary) hypertension: Secondary | ICD-10-CM | POA: Diagnosis not present

## 2019-01-01 LAB — CBC
HEMATOCRIT: 38.4 % (ref 36.0–46.0)
Hemoglobin: 12.9 g/dL (ref 12.0–15.0)
MCH: 31 pg (ref 26.0–34.0)
MCHC: 33.6 g/dL (ref 30.0–36.0)
MCV: 92.3 fL (ref 80.0–100.0)
Platelets: 242 10*3/uL (ref 150–400)
RBC: 4.16 MIL/uL (ref 3.87–5.11)
RDW: 15 % (ref 11.5–15.5)
WBC: 7.8 10*3/uL (ref 4.0–10.5)
nRBC: 0 % (ref 0.0–0.2)

## 2019-01-01 LAB — PROTIME-INR
INR: 0.9 (ref 0.8–1.2)
Prothrombin Time: 12.5 seconds (ref 11.4–15.2)

## 2019-01-01 MED ORDER — MIDAZOLAM HCL 2 MG/2ML IJ SOLN
INTRAMUSCULAR | Status: AC
  Filled 2019-01-01: qty 2

## 2019-01-01 MED ORDER — MIDAZOLAM HCL 2 MG/2ML IJ SOLN
INTRAMUSCULAR | Status: AC | PRN
  Administered 2019-01-01: 1 mg via INTRAVENOUS

## 2019-01-01 MED ORDER — FENTANYL CITRATE (PF) 100 MCG/2ML IJ SOLN
INTRAMUSCULAR | Status: AC | PRN
  Administered 2019-01-01: 50 ug via INTRAVENOUS

## 2019-01-01 MED ORDER — SODIUM CHLORIDE 0.9 % IV SOLN
INTRAVENOUS | Status: DC
  Administered 2019-01-01: 11:00:00 via INTRAVENOUS

## 2019-01-01 MED ORDER — FENTANYL CITRATE (PF) 100 MCG/2ML IJ SOLN
INTRAMUSCULAR | Status: AC
  Filled 2019-01-01: qty 2

## 2019-01-01 NOTE — Discharge Instructions (Signed)
Needle Biopsy, Care After  This sheet gives you information about how to care for yourself after your procedure. Your health care provider may also give you more specific instructions. If you have problems or questions, contact your health care provider.  What can I expect after the procedure?  After the procedure, it is common to have soreness, bruising, or mild pain at the puncture site. This should go away in a few days.  Follow these instructions at home:  Needle insertion site care     Wash your hands with soap and water before you change your bandage (dressing). If you cannot use soap and water, use hand sanitizer.   Follow instructions from your health care provider about how to take care of your puncture site. This includes:  ? When and how to change your dressing.  ? When to remove your dressing.   Check your puncture site every day for signs of infection. Check for:  ? Redness, swelling, or pain.  ? Fluid or blood.  ? Pus or a bad smell.  ? Warmth.  General instructions   Return to your normal activities as told by your health care provider. Ask your health care provider what activities are safe for you.   Do not take baths, swim, or use a hot tub until your health care provider approves. Ask your health care provider if you may take showers. You may only be allowed to take sponge baths.   Take over-the-counter and prescription medicines only as told by your health care provider.   Keep all follow-up visits as told by your health care provider. This is important.  Contact a health care provider if:   You have a fever.   You have redness, swelling, or pain at the puncture site that lasts longer than a few days.   You have fluid, blood, or pus coming from your puncture site.   Your puncture site feels warm to the touch.  Get help right away if:   You have severe bleeding from the puncture site.  Summary   After the procedure, it is common to have soreness, bruising, or mild pain at the puncture  site. This should go away in a few days.   Check your puncture site every day for signs of infection, such as redness, swelling, or pain.   Get help right away if you have severe bleeding from your puncture site.  This information is not intended to replace advice given to you by your health care provider. Make sure you discuss any questions you have with your health care provider.  Document Released: 02/17/2015 Document Revised: 10/16/2017 Document Reviewed: 10/16/2017  Elsevier Interactive Patient Education  2019 [employer]

## 2019-01-01 NOTE — Procedures (Signed)
L parotid mass core Bx 18 g times two EBL 0 Comp 0

## 2019-01-01 NOTE — H&P (Signed)
Chief Complaint: Patient was seen in consultation today for parotid mass  Referring Physician(s): Juengel,Paul  Supervising Physician: Marybelle Killings  Patient Status: [facility] - Out-pt  History of Present Illness: Erica Stone is a 58 y.o. female with past medical history of HTN, lobular carcinoma of the breast who presents to radiology with "lump in my throat."  Patient states the lump has been there since October of 2019.  She has been treated with 2 rounds of antibiotics, however it remains unchanged.  She presents to radiology today for neck mass biopsy.   Patient requests sedation.  She has been NPO.  She does not take blood thinners.  She denies new complaints today.  She is otherwise in her usual state of health.   Past Medical History:  Diagnosis Date   Bipolar disorder ([place])    DDD (degenerative disc disease), cervical    Depression    Hypertension    Lobular carcinoma of breast ([place]) 2006   lumpectomy- Rt, Chemo + rad tx's.   Neuropathy    Personal history of chemotherapy    Personal history of radiation therapy 2006/2007   RIGHT lumpectomy   Renal mass    Urinary frequency     Past Surgical History:  Procedure Laterality Date   ABDOMINAL HYSTERECTOMY  1992   fibroid- PJR   BREAST BIOPSY Right 2006   +   BREAST LUMPECTOMY Right 2006   lumpectomy w/ radiation and chemo   CERVICAL SPINE SURGERY  2011   GANGLION CYST EXCISION Left 07/28/2016   Procedure: REMOVAL GANGLION OF WRIST;  Surgeon: Hessie Knows, MD;  Location: [facility] ORS;  Service: Orthopedics;  Laterality: Left;   PARTIAL HYSTERECTOMY     TOE SURGERY Left    4th & 5th   WRIST SURGERY Left    ganglion with reoccurence    Allergies: Gadolinium derivatives and Other  Medications: Prior to Admission medications   Medication Sig Start Date End Date Taking? Authorizing Provider  Calcium Citrate-Vitamin D3 1000-400 LIQD Take by mouth.   Yes [provider]    Cholecalciferol (D 2000) 2000 UNITS TABS Take by mouth. Reported on 12/24/2015   Yes [provider]  cyclobenzaprine (FLEXERIL) 10 MG tablet Take 10 mg by mouth 3 (three) times daily as needed.    Yes [provider]  gabapentin (NEURONTIN) 600 MG tablet Take 600 mg by mouth 3 (three) times daily.  10/23/14  Yes [provider]  MELOXICAM PO Take by mouth.   Yes [provider]  Multiple Vitamins-Calcium (ONE-A-DAY WOMENS FORMULA PO) Take 1 tablet by mouth daily.   Yes [provider]  omeprazole (PRILOSEC) 20 MG capsule Take 20 mg by mouth daily.   Yes [provider]  triamterene-hydrochlorothiazide (MAXZIDE-25) 37.5-25 MG per tablet TAKE ONE (1) TABLET EACH DAY 03/10/15  Yes [provider]  Garlic 250 MG TABS Take by mouth.    [provider]     Family History  Problem Relation Age of Onset   Prostate cancer Father    Prostate cancer Brother    Diabetes Mother    Breast cancer Daughter 75   Ovarian cancer Neg Hx     Social History   Socioeconomic History   Marital status: Divorced    Spouse name: Not on file   Number of children: Not on file   Years of education: Not on file   Highest education level: Not on file  Occupational History   Not on file  Social  Needs   Financial resource strain: Not on file   Food insecurity:    Worry: Not on file    Inability: Not on file   Transportation needs:    Medical: Not on file    Non-medical: Not on file  Tobacco Use   Smoking status: Current Every Day Smoker    Packs/day: 0.50    Years: 36.00    Pack years: 18.00    Types: Cigarettes   Smokeless tobacco: Never Used  Substance and Sexual Activity   Alcohol use: Yes    Alcohol/week: 0.0 standard drinks    Comment: 3 x a week   Drug use: No   Sexual activity: Yes  Lifestyle   Physical activity:    Days per week: Not on file    Minutes per session: Not on file   Stress: Not on file   Relationships   Social connections:    Talks on phone: Not on file    Gets together: Not on file    Attends religious service: Not on file    Active member of club or organization: Not on file    Attends meetings of clubs or organizations: Not on file    Relationship status: Not on file  Other Topics Concern   Not on file  Social History Narrative   Not on file     Review of Systems: A 12 point ROS discussed and pertinent positives are indicated in the HPI above.  All other systems are negative.  Review of Systems  Constitutional: Negative for fatigue and fever.  Respiratory: Negative for cough and shortness of breath.   Cardiovascular: Negative for chest pain.  Gastrointestinal: Negative for abdominal pain.  Genitourinary: Negative for dysuria.  Musculoskeletal: Negative for back pain.  Psychiatric/Behavioral: Negative for behavioral problems and confusion.    Vital Signs: BP (!) 131/93    Pulse 78    Temp 98.2 F (36.8 C) (Oral)    Resp 18    Ht 5\' 6"  (1.676 m)    Wt 157 lb (71.2 kg)    SpO2 98%    BMI 25.34 kg/m   Physical Exam Vitals signs and nursing note reviewed.  Constitutional:      Appearance: Normal appearance.  HENT:     Mouth/Throat:     Mouth: Mucous membranes are moist.     Pharynx: Oropharynx is clear.  Neck:     Musculoskeletal: Normal range of motion and neck supple.  Cardiovascular:     Rate and Rhythm: Normal rate and regular rhythm.  Pulmonary:     Effort: Pulmonary effort is normal.     Breath sounds: Normal breath sounds.  Abdominal:     General: Abdomen is flat.     Palpations: Abdomen is soft.  Lymphadenopathy:     Cervical: Cervical adenopathy (palpable mass under the left mandible, well circumscribed) present.  Skin:    General: Skin is warm and dry.  Neurological:     General: No focal deficit present.     Mental Status: She is alert and oriented to person, place, and time. Mental status is at baseline.  Psychiatric:         Mood and Affect: Mood normal.        Behavior: Behavior normal.        Thought Content: Thought content normal.        Judgment: Judgment normal.      MD Evaluation Airway: WNL Heart: WNL Abdomen: WNL Chest/ Lungs: WNL ASA  Classification: 3 Mallampati/Airway Score: One   Imaging: Ct Soft Tissue Neck W Contrast  Result Date: 12/11/2018 CLINICAL DATA:  Lymphadenopathy. Left neck mass for 4 months. Sinus drainage. History of breast cancer. EXAM: CT NECK WITH CONTRAST TECHNIQUE: Multidetector CT imaging of the neck was performed using the standard protocol following the bolus administration of intravenous contrast. CONTRAST:  65mL OMNIPAQUE IOHEXOL 300 MG/ML  SOLN COMPARISON:  06/04/2009 FINDINGS: Pharynx and larynx: No evidence of mass or swelling. Salivary glands: The palpable abnormality corresponds to a 1.3 cm mass in the superficial, inferior left parotid gland demonstrating hyperenhancement peripherally with a small region of central hypoenhancement, new from 2010. The right parotid gland and both submandibular glands are unremarkable. Thyroid: Unremarkable. Lymph nodes: No enlarged or suspicious lymph nodes in the neck. Vascular: Major vascular structures of the neck are patent. Asymmetric atherosclerotic plaque about the left carotid bifurcation without evidence of flow limiting stenosis. Limited intracranial: Unremarkable. Visualized orbits: Unremarkable. Mastoids and visualized paranasal sinuses: Small volume right sphenoid sinus fluid. Visualized mastoid air cells are clear. Skeleton: C5-C7 ACDF with solid arthrodesis. Mild disc degeneration at C3-4 and C4-5. Upper chest: Centrilobular and paraseptal emphysema and biapical lung scarring. Small volume material partially visualized in the distal left mainstem bronchus, likely mucous. Other: None. IMPRESSION: 1. 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 2. No evidence of cervical lymphadenopathy. Electronically  Signed   By: Logan Bores M.D.   On: 12/11/2018 13:03    Labs:  CBC: Recent Labs    01/01/19 1018  WBC 7.8  HGB 12.9  HCT 38.4  PLT 242    COAGS: Recent Labs    01/01/19 1018  INR 0.9    BMP: Recent Labs    12/11/18 1010  CREATININE 0.90    LIVER FUNCTION TESTS: No results for input(s): BILITOT, AST, ALT, ALKPHOS, PROT, ALBUMIN in the last 8760 hours.  TUMOR MARKERS: No results for input(s): AFPTM, CEA, CA199, CHROMGRNA in the last 8760 hours.  Assessment and Plan: Patient with past medical history of breast cancer presents with complaint of neck mass.  IR consulted for biopsy at the request of Dr. Kathyrn Sheriff. Case reviewed by Dr. Earleen Newport who approves patient for procedure.  Patient presents today in their usual state of health.  She has been NPO and is not currently on blood thinners.   Risks and benefits of neck mass biopsy was discussed with the patient and/or patient's family including, but not limited to bleeding, infection, damage to adjacent structures or low yield requiring additional tests.  All of the questions were answered and there is agreement to proceed.  Consent signed and in chart.  Thank you for this interesting consult.  I greatly enjoyed meeting Dela Sweeny and look forward to participating in their care.  A copy of this report was sent to the requesting provider on this date.  Electronically Signed: Docia Barrier, PA 01/01/2019, 10:41 AM   I spent a total of  30 Minutes   in face to face in clinical consultation, greater than 50% of which was counseling/coordinating care for neck mass.

## 2019-01-03 LAB — SURGICAL PATHOLOGY

## 2019-03-12 ENCOUNTER — Ambulatory Visit: Payer: Self-pay | Admitting: Otolaryngology

## 2019-03-15 ENCOUNTER — Other Ambulatory Visit: Payer: Self-pay

## 2019-03-15 ENCOUNTER — Encounter
Admission: RE | Admit: 2019-03-15 | Discharge: 2019-03-15 | Disposition: A | Discharge status: Home / Self Care | Payer: Medicare Other | Source: Ambulatory Visit | Attending: Otolaryngology | Admitting: Otolaryngology

## 2019-03-15 DIAGNOSIS — I10 Essential (primary) hypertension: Secondary | ICD-10-CM | POA: Diagnosis not present

## 2019-03-15 DIAGNOSIS — Z1159 Encounter for screening for other viral diseases: Secondary | ICD-10-CM | POA: Diagnosis not present

## 2019-03-15 DIAGNOSIS — Z01818 Encounter for other preprocedural examination: Secondary | ICD-10-CM | POA: Insufficient documentation

## 2019-03-15 HISTORY — DX: Gastro-esophageal reflux disease without esophagitis: K21.9

## 2019-03-15 LAB — CBC
HCT: 40.9 % (ref 36.0–46.0)
Hemoglobin: 13.7 g/dL (ref 12.0–15.0)
MCH: 30.7 pg (ref 26.0–34.0)
MCHC: 33.5 g/dL (ref 30.0–36.0)
MCV: 91.7 fL (ref 80.0–100.0)
Platelets: 281 10*3/uL (ref 150–400)
RBC: 4.46 MIL/uL (ref 3.87–5.11)
RDW: 14.6 % (ref 11.5–15.5)
WBC: 11.1 10*3/uL — ABNORMAL HIGH (ref 4.0–10.5)
nRBC: 0 % (ref 0.0–0.2)

## 2019-03-15 LAB — BASIC METABOLIC PANEL
Anion gap: 13 (ref 5–15)
BUN: 15 mg/dL (ref 6–20)
CO2: 25 mmol/L (ref 22–32)
Calcium: 10.6 mg/dL — ABNORMAL HIGH (ref 8.9–10.3)
Chloride: 100 mmol/L (ref 98–111)
Creatinine, Ser: 0.9 mg/dL (ref 0.44–1.00)
GFR calc Af Amer: >60 mL/min (ref >60)
GFR calc non Af Amer: >60 mL/min (ref >60)
Glucose, Bld: 117 mg/dL — ABNORMAL HIGH (ref 70–99)
Potassium: 3.6 mmol/L (ref 3.5–5.1)
Sodium: 138 mmol/L (ref 135–145)

## 2019-03-15 NOTE — Patient Instructions (Signed)
Your procedure is scheduled on: Wednesday 03/20/19 Report to [place]. To find out your arrival time please call [phone] between 1PM - 3PM on Tuesday 03/19/19.  Remember: Instructions that are not followed completely may result in serious medical risk, up to and including death, or upon the discretion of your surgeon and anesthesiologist your surgery may need to be rescheduled.     _X__ 1. Do not eat food after midnight the night before your procedure.                 No gum chewing or hard candies. You may drink clear liquids up to 2 hours                 before you are scheduled to arrive for your surgery- DO not drink clear                 liquids within 2 hours of the start of your surgery.                 Clear Liquids include:  water, apple juice without pulp, clear carbohydrate                 drink such as Clearfast or Gatorade, Black Coffee or Tea (Do not add                 anything to coffee or tea).  __X__2.  On the morning of surgery brush your teeth with toothpaste and water, you                 may rinse your mouth with mouthwash if you wish.  Do not swallow any              toothpaste of mouthwash.     _X__ 3.  No Alcohol for 24 hours before or after surgery.   _X__ 4.  Do Not Smoke or use e-cigarettes For 24 Hours Prior to Your Surgery.                 Do not use any chewable tobacco products for at least 6 hours prior to                 surgery.  ____  5.  Bring all medications with you on the day of surgery if instructed.   __X__  6.  Notify your doctor if there is any change in your medical condition      (cold, fever, infections).     Do not wear jewelry, make-up, hairpins, clips or nail polish. Do not wear lotions, powders, or perfumes.  Do not shave 48 hours prior to surgery. Men may shave face and neck. Do not bring valuables to the hospital.    [facility] is not responsible for any belongings or  valuables.  Contacts, dentures/partials or body piercings may not be worn into surgery. Bring a case for your contacts, glasses or hearing aids, a denture cup will be supplied. Leave your suitcase in the car. After surgery it may be brought to your room. For patients admitted to the hospital, discharge time is determined by your treatment team.   Patients discharged the day of surgery will not be allowed to drive home.   Please read over the following fact sheets that you were given:   MRSA Information  __X__ Take these medicines the morning of surgery with A SIP OF WATER:  1. gabapentin (NEURONTIN)   2. omeprazole (PRILOSEC)  3.   4.  5.  6.  ____ Fleet Enema (as directed)   __X__ Use CHG Soap/SAGE wipes as directed  ____ Use inhalers on the day of surgery  ____ Stop metformin/Janumet/Farxiga 2 days prior to surgery    ____ Take 1/2 of usual insulin dose the night before surgery. No insulin the morning          of surgery.   ____ Stop Blood Thinners Coumadin/Plavix/Xarelto/Pleta/Pradaxa/Eliquis/Effient/Aspirin  on   Or contact your Surgeon, Cardiologist or Medical Doctor regarding  ability to stop your blood thinners  __X__ Stop Anti-inflammatories 7 days before surgery such as Advil, Ibuprofen, Motrin,  BC or Goodies Powder, Naprosyn, Naproxen, Aleve, Aspirin    __X__ Stop all herbal supplements, fish oil or vitamin E until after surgery.    ____ Bring C-Pap to the hospital.

## 2019-03-16 LAB — NOVEL CORONAVIRUS, NAA (HOSP ORDER, SEND-OUT TO REF LAB; TAT 18-24 HRS): SARS-CoV-2, NAA: NOT DETECTED

## 2019-03-20 ENCOUNTER — Ambulatory Visit: Payer: Medicare Other | Admitting: Anesthesiology

## 2019-03-20 ENCOUNTER — Encounter: Payer: Self-pay | Admitting: *Deleted

## 2019-03-20 ENCOUNTER — Observation Stay
Admission: RE | Admit: 2019-03-20 | Discharge: 2019-03-21 | Disposition: A | Discharge status: Home / Self Care | Payer: Medicare Other | Attending: Otolaryngology | Admitting: Otolaryngology

## 2019-03-20 ENCOUNTER — Encounter
Admission: RE | Disposition: A | Discharge status: Home / Self Care | Payer: Self-pay | Source: Home / Self Care | Attending: Otolaryngology

## 2019-03-20 ENCOUNTER — Other Ambulatory Visit: Payer: Self-pay

## 2019-03-20 DIAGNOSIS — Z90710 Acquired absence of both cervix and uterus: Secondary | ICD-10-CM | POA: Diagnosis not present

## 2019-03-20 DIAGNOSIS — Z853 Personal history of malignant neoplasm of breast: Secondary | ICD-10-CM | POA: Diagnosis not present

## 2019-03-20 DIAGNOSIS — K589 Irritable bowel syndrome without diarrhea: Secondary | ICD-10-CM | POA: Insufficient documentation

## 2019-03-20 DIAGNOSIS — M1990 Unspecified osteoarthritis, unspecified site: Secondary | ICD-10-CM | POA: Insufficient documentation

## 2019-03-20 DIAGNOSIS — Z79899 Other long term (current) drug therapy: Secondary | ICD-10-CM | POA: Insufficient documentation

## 2019-03-20 DIAGNOSIS — D110 Benign neoplasm of parotid gland: Principal | ICD-10-CM | POA: Diagnosis present

## 2019-03-20 DIAGNOSIS — K219 Gastro-esophageal reflux disease without esophagitis: Secondary | ICD-10-CM | POA: Insufficient documentation

## 2019-03-20 DIAGNOSIS — F319 Bipolar disorder, unspecified: Secondary | ICD-10-CM | POA: Diagnosis not present

## 2019-03-20 DIAGNOSIS — Z9221 Personal history of antineoplastic chemotherapy: Secondary | ICD-10-CM | POA: Insufficient documentation

## 2019-03-20 DIAGNOSIS — M5030 Other cervical disc degeneration, unspecified cervical region: Secondary | ICD-10-CM | POA: Insufficient documentation

## 2019-03-20 DIAGNOSIS — Z8249 Family history of ischemic heart disease and other diseases of the circulatory system: Secondary | ICD-10-CM | POA: Insufficient documentation

## 2019-03-20 DIAGNOSIS — I10 Essential (primary) hypertension: Secondary | ICD-10-CM | POA: Diagnosis not present

## 2019-03-20 DIAGNOSIS — F17200 Nicotine dependence, unspecified, uncomplicated: Secondary | ICD-10-CM | POA: Diagnosis not present

## 2019-03-20 DIAGNOSIS — Z923 Personal history of irradiation: Secondary | ICD-10-CM | POA: Insufficient documentation

## 2019-03-20 DIAGNOSIS — Z833 Family history of diabetes mellitus: Secondary | ICD-10-CM | POA: Insufficient documentation

## 2019-03-20 DIAGNOSIS — Z823 Family history of stroke: Secondary | ICD-10-CM | POA: Diagnosis not present

## 2019-03-20 DIAGNOSIS — Z841 Family history of disorders of kidney and ureter: Secondary | ICD-10-CM | POA: Insufficient documentation

## 2019-03-20 DIAGNOSIS — D490 Neoplasm of unspecified behavior of digestive system: Secondary | ICD-10-CM | POA: Diagnosis present

## 2019-03-20 DIAGNOSIS — Z809 Family history of malignant neoplasm, unspecified: Secondary | ICD-10-CM | POA: Insufficient documentation

## 2019-03-20 DIAGNOSIS — Z791 Long term (current) use of non-steroidal anti-inflammatories (NSAID): Secondary | ICD-10-CM | POA: Insufficient documentation

## 2019-03-20 HISTORY — PX: PAROTIDECTOMY: SHX2163

## 2019-03-20 SURGERY — EXCISION, PAROTID GLAND
Anesthesia: General | Laterality: Left

## 2019-03-20 MED ORDER — ONDANSETRON 4 MG PO TBDP: 4.0000 mg | ORAL_TABLET | Freq: Four times a day (QID) | ORAL | Status: DC | PRN

## 2019-03-20 MED ORDER — LIDOCAINE HCL (CARDIAC) PF 100 MG/5ML IV SOSY
PREFILLED_SYRINGE | INTRAVENOUS | Status: DC | PRN
  Administered 2019-03-20: 100 mg via INTRAVENOUS

## 2019-03-20 MED ORDER — FENTANYL CITRATE (PF) 100 MCG/2ML IJ SOLN
INTRAMUSCULAR | Status: DC | PRN
  Administered 2019-03-20: 100 ug via INTRAVENOUS
  Administered 2019-03-20: 50 ug via INTRAVENOUS
  Administered 2019-03-20 (×2): 25 ug via INTRAVENOUS

## 2019-03-20 MED ORDER — PHENYLEPHRINE HCL (PRESSORS) 10 MG/ML IV SOLN
INTRAVENOUS | Status: DC | PRN
  Administered 2019-03-20 (×3): 100 ug via INTRAVENOUS

## 2019-03-20 MED ORDER — GLYCOPYRROLATE 0.2 MG/ML IJ SOLN
INTRAMUSCULAR | Status: AC
  Filled 2019-03-20: qty 1

## 2019-03-20 MED ORDER — BACITRACIN ZINC 500 UNIT/GM EX OINT
TOPICAL_OINTMENT | CUTANEOUS | Status: AC
  Filled 2019-03-20: qty 28.35

## 2019-03-20 MED ORDER — ONDANSETRON HCL 4 MG/2ML IJ SOLN: 4.0000 mg | Freq: Four times a day (QID) | INTRAMUSCULAR | Status: DC | PRN

## 2019-03-20 MED ORDER — LACTATED RINGERS IV SOLN
INTRAVENOUS | Status: DC
  Administered 2019-03-20 (×2): via INTRAVENOUS

## 2019-03-20 MED ORDER — HYDROMORPHONE HCL 1 MG/ML IJ SOLN
INTRAMUSCULAR | Status: AC
  Administered 2019-03-20: 11:00:00 0.25 mg via INTRAVENOUS
  Filled 2019-03-20: qty 1

## 2019-03-20 MED ORDER — MORPHINE SULFATE (PF) 4 MG/ML IV SOLN: 3.0000 mg | INTRAVENOUS | Status: DC | PRN

## 2019-03-20 MED ORDER — PROPOFOL 10 MG/ML IV BOLUS
INTRAVENOUS | Status: AC
  Filled 2019-03-20: qty 40

## 2019-03-20 MED ORDER — ACETAMINOPHEN 10 MG/ML IV SOLN
INTRAVENOUS | Status: DC | PRN
  Administered 2019-03-20: 1000 mg via INTRAVENOUS

## 2019-03-20 MED ORDER — ACETAMINOPHEN 10 MG/ML IV SOLN
INTRAVENOUS | Status: AC
  Filled 2019-03-20: qty 100

## 2019-03-20 MED ORDER — ONDANSETRON HCL 4 MG/2ML IJ SOLN
INTRAMUSCULAR | Status: AC
  Filled 2019-03-20: qty 2

## 2019-03-20 MED ORDER — SUCCINYLCHOLINE CHLORIDE 20 MG/ML IJ SOLN
INTRAMUSCULAR | Status: DC | PRN
  Administered 2019-03-20: 100 mg via INTRAVENOUS

## 2019-03-20 MED ORDER — ROCURONIUM BROMIDE 50 MG/5ML IV SOLN
INTRAVENOUS | Status: AC
  Filled 2019-03-20: qty 1

## 2019-03-20 MED ORDER — GLYCOPYRROLATE 0.2 MG/ML IJ SOLN
INTRAMUSCULAR | Status: DC | PRN
  Administered 2019-03-20: 0.2 mg via INTRAVENOUS

## 2019-03-20 MED ORDER — DEXAMETHASONE SODIUM PHOSPHATE 10 MG/ML IJ SOLN
INTRAMUSCULAR | Status: AC
  Filled 2019-03-20: qty 1

## 2019-03-20 MED ORDER — BISACODYL 5 MG PO TBEC: 5.0000 mg | DELAYED_RELEASE_TABLET | Freq: Every day | ORAL | Status: DC | PRN

## 2019-03-20 MED ORDER — LIDOCAINE HCL (PF) 2 % IJ SOLN
INTRAMUSCULAR | Status: AC
  Filled 2019-03-20: qty 10

## 2019-03-20 MED ORDER — FENTANYL CITRATE (PF) 100 MCG/2ML IJ SOLN
INTRAMUSCULAR | Status: AC
  Filled 2019-03-20: qty 2

## 2019-03-20 MED ORDER — PHENYLEPHRINE HCL (PRESSORS) 10 MG/ML IV SOLN
INTRAVENOUS | Status: AC
  Filled 2019-03-20: qty 1

## 2019-03-20 MED ORDER — LIDOCAINE-EPINEPHRINE (PF) 1 %-1:200000 IJ SOLN
INTRAMUSCULAR | Status: AC
  Filled 2019-03-20: qty 30

## 2019-03-20 MED ORDER — PROPOFOL 10 MG/ML IV BOLUS
INTRAVENOUS | Status: DC | PRN
  Administered 2019-03-20: 100 mg via INTRAVENOUS
  Administered 2019-03-20: 150 mg via INTRAVENOUS
  Administered 2019-03-20: 50 mg via INTRAVENOUS

## 2019-03-20 MED ORDER — HYDROCODONE-ACETAMINOPHEN 5-325 MG PO TABS
ORAL_TABLET | ORAL | Status: AC
  Administered 2019-03-20: 2 via ORAL
  Filled 2019-03-20: qty 2

## 2019-03-20 MED ORDER — HYDROCODONE-ACETAMINOPHEN 5-325 MG PO TABS
1.0000 | ORAL_TABLET | ORAL | Status: DC | PRN
  Administered 2019-03-20: 2 via ORAL

## 2019-03-20 MED ORDER — LIDOCAINE-EPINEPHRINE (PF) 1 %-1:200000 IJ SOLN
INTRAMUSCULAR | Status: DC | PRN
  Administered 2019-03-20: 7 mL

## 2019-03-20 MED ORDER — GABAPENTIN 600 MG PO TABS
600.0000 mg | ORAL_TABLET | Freq: Three times a day (TID) | ORAL | Status: DC
  Administered 2019-03-20 – 2019-03-21 (×3): 600 mg via ORAL
  Filled 2019-03-20 (×3): qty 1

## 2019-03-20 MED ORDER — MAGNESIUM HYDROXIDE 400 MG/5ML PO SUSP: 30.0000 mL | Freq: Every day | ORAL | Status: DC | PRN

## 2019-03-20 MED ORDER — MIDAZOLAM HCL 2 MG/2ML IJ SOLN
INTRAMUSCULAR | Status: DC | PRN
  Administered 2019-03-20: 2 mg via INTRAVENOUS

## 2019-03-20 MED ORDER — TRIAMTERENE-HCTZ 37.5-25 MG PO TABS
1.0000 | ORAL_TABLET | Freq: Every day | ORAL | Status: DC
  Administered 2019-03-20 – 2019-03-21 (×2): 1 via ORAL
  Filled 2019-03-20 (×2): qty 1

## 2019-03-20 MED ORDER — ROCURONIUM BROMIDE 100 MG/10ML IV SOLN
INTRAVENOUS | Status: DC | PRN
  Administered 2019-03-20: 5 mg via INTRAVENOUS

## 2019-03-20 MED ORDER — PANTOPRAZOLE SODIUM 40 MG PO TBEC
40.0000 mg | DELAYED_RELEASE_TABLET | Freq: Every day | ORAL | Status: DC
  Administered 2019-03-20 – 2019-03-21 (×2): 40 mg via ORAL
  Filled 2019-03-20 (×2): qty 1

## 2019-03-20 MED ORDER — DEXTROSE-NACL 5-0.45 % IV SOLN
INTRAVENOUS | Status: DC
  Administered 2019-03-20: 15:00:00 via INTRAVENOUS

## 2019-03-20 MED ORDER — HYDROMORPHONE HCL 1 MG/ML IJ SOLN
0.2500 mg | INTRAMUSCULAR | Status: DC | PRN
  Administered 2019-03-20 (×4): 0.25 mg via INTRAVENOUS

## 2019-03-20 MED ORDER — EPHEDRINE SULFATE 50 MG/ML IJ SOLN
INTRAMUSCULAR | Status: AC
  Filled 2019-03-20: qty 1

## 2019-03-20 MED ORDER — SEVOFLURANE IN SOLN
RESPIRATORY_TRACT | Status: AC
  Filled 2019-03-20: qty 250

## 2019-03-20 MED ORDER — DEXAMETHASONE SODIUM PHOSPHATE 10 MG/ML IJ SOLN
INTRAMUSCULAR | Status: DC | PRN
  Administered 2019-03-20: 10 mg via INTRAVENOUS

## 2019-03-20 MED ORDER — MIDAZOLAM HCL 2 MG/2ML IJ SOLN
INTRAMUSCULAR | Status: AC
  Filled 2019-03-20: qty 2

## 2019-03-20 MED ORDER — SUCCINYLCHOLINE CHLORIDE 20 MG/ML IJ SOLN
INTRAMUSCULAR | Status: AC
  Filled 2019-03-20: qty 1

## 2019-03-20 MED ORDER — FLEET ENEMA 7-19 GM/118ML RE ENEM: 1.0000 | ENEMA | Freq: Once | RECTAL | Status: DC | PRN

## 2019-03-20 SURGICAL SUPPLY — 43 items
BLADE SURG 15 STRL LF DISP TIS (BLADE) ×1 IMPLANT
BLADE SURG 15 STRL SS (BLADE) ×2
CORD BIP STRL DISP 12FT (MISCELLANEOUS) ×3 IMPLANT
COVER WAND RF STERILE (DRAPES) ×3 IMPLANT
DERMABOND ADVANCED (GAUZE/BANDAGES/DRESSINGS) ×2
DERMABOND ADVANCED .7 DNX12 (GAUZE/BANDAGES/DRESSINGS) ×1 IMPLANT
DRAIN TLS ROUND 10FR (DRAIN) ×3 IMPLANT
DRAPE INCISE 23X17 IOBAN STRL (DRAPES) ×4
DRAPE INCISE IOBAN 23X17 STRL (DRAPES) ×2 IMPLANT
DRAPE MAG INST 16X20 L/F (DRAPES) ×3 IMPLANT
DRAPE SURG 17X11 SM STRL (DRAPES) ×3 IMPLANT
DRSG TEGADERM 2-3/8X2-3/4 SM (GAUZE/BANDAGES/DRESSINGS) ×9 IMPLANT
DRSG TEGADERM 2X2.25 PEDS (GAUZE/BANDAGES/DRESSINGS) ×3 IMPLANT
DRSG TEGADERM 4X4.75 (GAUZE/BANDAGES/DRESSINGS) ×3 IMPLANT
DRSG TELFA 4X3 1S NADH ST (GAUZE/BANDAGES/DRESSINGS) ×3 IMPLANT
ELECT CAUTERY BLADE TIP 2.5 (TIP) ×3
ELECT EMG 20MM DUAL (MISCELLANEOUS) ×6
ELECT NEEDLE 20X.3 GREEN (MISCELLANEOUS) ×3
ELECT REM PT RETURN 9FT ADLT (ELECTROSURGICAL) ×3
ELECTRODE CAUTERY BLDE TIP 2.5 (TIP) ×1 IMPLANT
ELECTRODE EMG 20MM DUAL (MISCELLANEOUS) ×2 IMPLANT
ELECTRODE NEEDLE 20X.3 GREEN (MISCELLANEOUS) ×1 IMPLANT
ELECTRODE REM PT RTRN 9FT ADLT (ELECTROSURGICAL) ×1 IMPLANT
FORCEPS JEWEL BIP 4-3/4 STR (INSTRUMENTS) ×3 IMPLANT
GAUZE 4X4 16PLY RFD (DISPOSABLE) ×3 IMPLANT
GLOVE BIO SURGEON STRL SZ7.5 (GLOVE) ×3 IMPLANT
GLOVE PROTEXIS LATEX SZ 7.5 (GLOVE) ×3 IMPLANT
GOWN STRL REUS W/ TWL LRG LVL3 (GOWN DISPOSABLE) ×3 IMPLANT
GOWN STRL REUS W/TWL LRG LVL3 (GOWN DISPOSABLE) ×6
HOOK STAY BLUNT/RETRACTOR 5M (MISCELLANEOUS) ×3 IMPLANT
LABEL OR SOLS (LABEL) ×3 IMPLANT
PACK HEAD/NECK (MISCELLANEOUS) ×3 IMPLANT
PAD TELFA 2X3 NADH STRL (GAUZE/BANDAGES/DRESSINGS) ×3 IMPLANT
PROBE MONO 100X0.75 ELECT 1.9M (MISCELLANEOUS) ×3 IMPLANT
SHEARS HARMONIC 9CM CVD (BLADE) ×3 IMPLANT
SPONGE KITTNER 5P (MISCELLANEOUS) ×6 IMPLANT
SUT ETHILON 6 0 9-3 1X18 BLK (SUTURE) ×3 IMPLANT
SUT MERSILENE 4-0 WHT RB-1 (SUTURE) ×6 IMPLANT
SUT PROLENE 6 0 PC 1 (SUTURE) ×6 IMPLANT
SUT SILK 2 0 (SUTURE) ×2
SUT SILK 2-0 18XBRD TIE 12 (SUTURE) ×1 IMPLANT
SUT VIC AB 4-0 RB1 18 (SUTURE) ×3 IMPLANT
SYSTEM CHEST DRAIN TLS 7FR (DRAIN) ×3 IMPLANT

## 2019-03-20 NOTE — Anesthesia Preprocedure Evaluation (Addendum)
Anesthesia Evaluation  Patient identified by MRN, date of birth, ID band Patient awake    Reviewed: Allergy & Precautions, H&P , NPO status , Patient's Chart, lab work & pertinent test results  History of Anesthesia Complications Negative for: history of anesthetic complications  Airway Mallampati: II  TM Distance: >3 FB Neck ROM: limited    Dental  (+) Teeth Intact   Pulmonary Current Smoker,           Cardiovascular hypertension,      Neuro/Psych PSYCHIATRIC DISORDERS Depression Bipolar Disorder H/o cervical spine surgery    GI/Hepatic Neg liver ROS, GERD  Controlled,  Endo/Other  negative endocrine ROS  Renal/GU      Musculoskeletal  (+) Arthritis ,   Abdominal   Peds  Hematology negative hematology ROS (+)   Anesthesia Other Findings Past Medical History: No date: Bipolar disorder ([place]) No date: DDD (degenerative disc disease), cervical No date: Depression No date: GERD (gastroesophageal reflux disease) No date: Hypertension 2006: Lobular carcinoma of breast ([place])     Comment:  lumpectomy- Rt, Chemo + rad tx's. No date: Neuropathy No date: Personal history of chemotherapy 2006/2007: Personal history of radiation therapy     Comment:  RIGHT lumpectomy No date: Renal mass No date: Urinary frequency  Past Surgical History: 1992: ABDOMINAL HYSTERECTOMY     Comment:  fibroid- PJR 2006: BREAST BIOPSY; Right     Comment:  + 2006: BREAST LUMPECTOMY; Right     Comment:  lumpectomy w/ radiation and chemo 2011: CERVICAL SPINE SURGERY 07/28/2016: GANGLION CYST EXCISION; Left     Comment:  Procedure: REMOVAL GANGLION OF WRIST;  Surgeon: Hessie Knows, MD;  Location: [facility] ORS;  Service: Orthopedics;                Laterality: Left; No date: PARTIAL HYSTERECTOMY No date: TOE SURGERY; Left     Comment:  4th & 5th No date: WRIST SURGERY; Left     Comment:  ganglion with reoccurence      Reproductive/Obstetrics negative OB ROS                            Anesthesia Physical Anesthesia Plan  ASA: II  Anesthesia Plan: General ETT   Post-op Pain Management:    Induction:   PONV Risk Score and Plan: Ondansetron, Dexamethasone, Midazolam and Treatment may vary due to age or medical condition  Airway Management Planned:   Additional Equipment:   Intra-op Plan:   Post-operative Plan:   Informed Consent: I have reviewed the patients History and Physical, chart, labs and discussed the procedure including the risks, benefits and alternatives for the proposed anesthesia with the patient or authorized representative who has indicated his/her understanding and acceptance.     Dental Advisory Given  Plan Discussed with: Anesthesiologist and CRNA  Anesthesia Plan Comments:         Anesthesia Quick Evaluation

## 2019-03-20 NOTE — Anesthesia Postprocedure Evaluation (Signed)
Anesthesia Post Note  Patient: Erica Stone  Procedure(s) Performed: PAROTIDECTOMY LEFT SUPERFICIAL (Left )  Patient location during evaluation: PACU Anesthesia Type: General Level of consciousness: awake and alert Pain management: pain level controlled Vital Signs Assessment: post-procedure vital signs reviewed and stable Respiratory status: spontaneous breathing, nonlabored ventilation and respiratory function stable Cardiovascular status: blood pressure returned to baseline and stable Postop Assessment: no apparent nausea or vomiting Anesthetic complications: no     Last Vitals:  Vitals:   03/20/19 1234 03/20/19 1250  BP: 129/90 126/87  Pulse: 64 73  Resp: 13 14  Temp:    SpO2: 96% 95%    Last Pain:  Vitals:   03/20/19 1250  TempSrc:   PainSc: [place]

## 2019-03-20 NOTE — Progress Notes (Signed)
03/20/2019 6:24 PM  Erica Stone [id number]  Post-Op check    Temp:  [96.8 F (36 C)-98.3 F (36.8 C)] 98.3 F (36.8 C) (06/03 1602) Pulse Rate:  [63-91] 71 (06/03 1602) Resp:  [11-22] 20 (06/03 1602) BP: (114-144)/(59-93) 128/59 (06/03 1602) SpO2:  [92 %-100 %] 99 % (06/03 1602) Weight:  [74.3 kg] 74.3 kg (06/03 1520),     Intake/Output Summary (Last 24 hours) at 03/20/2019 1824 Last data filed at 03/20/2019 1732 Gross per 24 hour  Intake 1315.61 ml  Output 10 ml  Net 1305.61 ml    No results found for this or any previous visit (from the past 24 hour(s)).  SUBJECTIVE: Patient feels a little tight in her left cheek/ jaw area but is not having any acute pain.  She has been eating food fine.  OBJECTIVE: She is afebrile and her vital signs are normal.  Her wound is flat and the dressing is dry.  She had a new Vacutainer placed with 10 mL of drainage about 1 hour ago.  That is total for postop drainage.  She has good mobility of her face and no weakness of the facial muscles.  IMPRESSION: Doing very well postop so far.  PLAN: We will plan to see her early in the morning and change the dressing and remove her drain.  We will plan to discharge her home at that time assuming everything is going well.  Elon Alas Rashada Klontz 03/20/2019, 6:24 PM

## 2019-03-20 NOTE — Transfer of Care (Signed)
Immediate Anesthesia Transfer of Care Note  Patient: Erica Stone  Procedure(s) Performed: Procedure(s): PAROTIDECTOMY LEFT SUPERFICIAL (Left)  Patient Location: PACU  Anesthesia Type:General  Level of Consciousness: sedated  Airway & Oxygen Therapy: Patient Spontanous Breathing and Patient connected to face mask oxygen  Post-op Assessment: Report given to RN and Post -op Vital signs reviewed and stable  Post vital signs: Reviewed and stable  Last Vitals:  Vitals:   03/20/19 0700 03/20/19 1018  BP: (!) 144/93 139/86  Pulse: 71 91  Resp: 18 (!) 22  Temp: (!) 36 C (!) 36.2 C  SpO2: 86% 57%    Complications: No apparent anesthesia complications

## 2019-03-20 NOTE — Anesthesia Procedure Notes (Signed)
Procedure Name: Intubation Date/Time: 03/20/2019 8:30 AM Performed by: Doreen Salvage, CRNA Pre-anesthesia Checklist: Patient identified, Patient being monitored, Timeout performed, Emergency Drugs available and Suction available Patient Re-evaluated:Patient Re-evaluated prior to induction Oxygen Delivery Method: Circle system utilized Preoxygenation: Pre-oxygenation with 100% oxygen Induction Type: IV induction Ventilation: Mask ventilation without difficulty Laryngoscope Size: Mac, 3 and McGraph Grade View: Grade I Tube type: Oral Tube size: 7.0 mm Number of attempts: 1 Airway Equipment and Method: Stylet Placement Confirmation: ETT inserted through vocal cords under direct vision,  positive ETCO2 and breath sounds checked- equal and bilateral Secured at: 21 cm Tube secured with: Tape Dental Injury: Teeth and Oropharynx as per pre-operative assessment  Difficulty Due To: Difficult Airway- due to reduced neck mobility

## 2019-03-20 NOTE — Op Note (Signed)
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  The nerve was intact and all branches moved with stimulation at the end of the case.   Procedure: The patient was brought to the operating room and placed in a supine position.  She was given general anesthesia by oral endotracheal intubation.  Once the patient was asleep a shoulder roll was placed and her head was turned to the right to expose the left parotid mass.  Skin marking was placed starting at the tragus level and going down the front of the auricle, around the earlobe, and then extending down to a skin crease in the left lateral neck.  7 mL of 1% Xylocaine with epi 1:100,000 was used for infiltration the skin near the incision line.  She was prepped and draped in sterile fashion.  The incision was made as previously marked.  This was carried through skin and subcu.  Bleeding was controlled with electrocautery.  Skin hooks were used to elevate the anterior skin edge and an anterior flap was created in the subcutaneous plane above the SMAS.  Posteriorly the skin was elevated some as well.  Dural hooks were used to hold these open.  The parotid was separated from the sternocleidomastoid fascia and rotated forward.  Dissection was carried down between the parotid and the auricular cartilage.  This dissection was carried down to the level of the pointer and the facial nerve was found.  Inferiorly the parotid was separated from the sternocleidomastoid muscle to get down to the posterior digastric.  This was going underneath the tumor as it plate inferiorly  and superficially in the parotid gland.  Once the nerve was in clear vision it was followed until the inferior branch split off.  The inferior branch was followed as it went down underneath the tumor.  The retro-facial vein was evident and was running right along with the marginal mandibular branch of the nerve.  This split into 2 and a cervical branch was evident as well running underneath the tumor.  The parotid fascia was incised to split the inferior portion of the parotid gland from the superior portion.  The tumor was then peeled downward following the lower branches of the facial nerve until they were completely separated from the tumor and diving the.  There was a lymph node at the bottom of the tumor that was evident and was taken with the specimen.  The entire tumor with a cuff of tissue around it and the lymph node was removed and sent for permanent section.  The marginal mandibular branch and the cervical branch the facial nerve were clearly evident overlying the retro-fascial vein.  The superior branches of the nerve were not dissected out but just the trunk up until the inferior branch came off.  The trunk was then stimulated and all branches the face moved.  The wound was irrigated with saline and there was no bleeding anywhere.  The wound was extremely dry.  A 10 French TLS drain was then placed into the wound through separate inferior stab incision.  The parotid fascia was then pulled back over this and with 4-0 Mersilene mattress sutures the parotid fascia was tacked to the sternocleidomastoid fascia.  This closed the defect as it was pulled back along the length of the wound.  The skin edges were then draped in the anterior skin was overlying the posterior skin in its midportion below the earlobe.  Approximately 8 mm of skin was trimmed from the anterior flap.  The skin edges of the flap were then held together using 4-0 Vicryls in a subcu/dermal stitch.  The skin edges were then held in  apposition with a 6-0 Prolene in a running locking fashion.  The wound came together very smoothly and it was washed and cleaned.  Some bacitracin was placed over it followed by Telfa and Tegaderm.  The drain was placed to low continuous Vacutainer suction  Dispo:   To PACU to then be transferred to the surgical floor for observation  Plan: Will evaluate again tonight see how she is doing and the plan would be to see her in the morning, remove the drain, and discharge home at that time.  If she is doing really well tonight we potentially could pull the drain and send her home tonight since her wound is so dry currently.  Elon Alas Erica Stone  03/20/2019 10:28 AM

## 2019-03-20 NOTE — Anesthesia Post-op Follow-up Note (Signed)
Anesthesia QCDR form completed.        

## 2019-03-20 NOTE — H&P (Signed)
H&P has been reviewed and patient reevaluated, no changes necessary. To be downloaded later.  

## 2019-03-21 DIAGNOSIS — D110 Benign neoplasm of parotid gland: Secondary | ICD-10-CM | POA: Diagnosis not present

## 2019-03-21 MED ORDER — HYDROCODONE-ACETAMINOPHEN 5-325 MG PO TABS: 1.0000 | ORAL_TABLET | Freq: Four times a day (QID) | ORAL | 0 refills | Status: AC | PRN

## 2019-03-21 NOTE — Discharge Summary (Signed)
Physician Discharge Summary  Patient ID: Erica Stone MRN: [id number] DOB/AGE: 59-26-1962 59 y.o.  Admit date: 03/20/2019 Discharge date: 03/21/2019  Admission Diagnoses:  Discharge Diagnoses:  Active Problems:   Benign mixed tumor of parotid   Discharged Condition: good  Hospital Course: The patient had left superficial parotidectomy and removal of parotid tumor done yesterday.  She has been very stable overnight with very little pain.  Her drain was removed this morning.  She has no weakness of her facial muscles.  She is eating well and is discharged home this morning to follow-up in the office in 6 days for suture removal.  She is given some hydrocodone with Tylenol for pain to use as needed.  She can be on a regular diet  Consults: None  Significant Diagnostic Studies: None  Treatments: surgery: Left superficial parotidectomy with removal of left parotid tumor  Discharge Exam: Blood pressure 124/89, pulse 67, temperature 98 F (36.7 C), temperature source Oral, resp. rate 18, height 5\' 6"  (1.676 m), weight 74.3 kg, SpO2 100 %.  the wound is redressed and the drain is removed.  Wound is healing excellently and there is no swelling in the left neck or face.  Disposition: Discharge disposition: 01-Home or Self Care       Discharge Instructions    Call MD for:  hives   Complete by:  As directed    Call MD for:  persistant nausea and vomiting   Complete by:  As directed    Call MD for:  redness, tenderness, or signs of infection (pain, swelling, redness, odor or green/yellow discharge around incision site)   Complete by:  As directed    Call MD for:  severe uncontrolled pain   Complete by:  As directed    Call MD for:  temperature >100.4   Complete by:  As directed    Diet general   Complete by:  As directed    Increase activity slowly   Complete by:  As directed      Allergies as of 03/21/2019      Reactions   Gadolinium Derivatives    Other reaction(s): Vomiting    Other    Other reaction(s): Other (See Comments) Gotalonium IV dye- hot with vomiting      Medication List    TAKE these medications   CITRACAL + D PO Take 1 tablet by mouth daily.   D 2000 50 MCG (2000 UT) Tabs Generic drug:  Cholecalciferol Take 200 Units by mouth daily. Reported on 12/24/2015   gabapentin 600 MG tablet Commonly known as:  NEURONTIN Take 600 mg by mouth 3 (three) times daily.   HYDROcodone-acetaminophen 5-325 MG tablet Commonly known as:  NORCO/VICODIN Take 1-2 tablets by mouth every 6 (six) hours as needed for up to 7 days for moderate pain.   meloxicam 7.5 MG tablet Commonly known as:  MOBIC Take 7.5 mg by mouth daily.   omeprazole 40 MG capsule Commonly known as:  PRILOSEC Take 40 mg by mouth daily.   ONE-A-DAY WOMENS FORMULA PO Take 1 tablet by mouth daily.   triamterene-hydrochlorothiazide 37.5-25 MG tablet Commonly known as:  MAXZIDE-25 Take 1 tablet by mouth daily.        SignedHuey Romans 03/21/2019, 6:36 AM

## 2019-03-21 NOTE — Care Management Obs Status (Signed)
[facility] NOTIFICATION   Patient Details  Name: Erica Stone MRN: [id number] Date of Birth: 1961/10/14   Medicare Observation Status Notification Given:  (admitted obs less than 24 hours)    Shela Leff, LCSW 03/21/2019, 10:02 AM

## 2019-03-21 NOTE — Progress Notes (Signed)
Nsg Discharge Note  Admit Date:  03/20/2019 Discharge date: 03/21/2019   Erica Stone to be D/C'd Home per MD order.  AVS completed.  Copy for chart, and copy for patient signed, and dated. Patient/caregiver able to verbalize understanding.  Discharge Medication: Allergies as of 03/21/2019      Reactions   Gadolinium Derivatives    Other reaction(s): Vomiting   Other    Other reaction(s): Other (See Comments) Gotalonium IV dye- hot with vomiting      Medication List    TAKE these medications   CITRACAL + D PO Take 1 tablet by mouth daily.   D 2000 50 MCG (2000 UT) Tabs Generic drug:  Cholecalciferol Take 200 Units by mouth daily. Reported on 12/24/2015   gabapentin 600 MG tablet Commonly known as:  NEURONTIN Take 600 mg by mouth 3 (three) times daily.   HYDROcodone-acetaminophen 5-325 MG tablet Commonly known as:  NORCO/VICODIN Take 1-2 tablets by mouth every 6 (six) hours as needed for up to 7 days for moderate pain.   meloxicam 7.5 MG tablet Commonly known as:  MOBIC Take 7.5 mg by mouth daily.   omeprazole 40 MG capsule Commonly known as:  PRILOSEC Take 40 mg by mouth daily.   ONE-A-DAY WOMENS FORMULA PO Take 1 tablet by mouth daily.   triamterene-hydrochlorothiazide 37.5-25 MG tablet Commonly known as:  MAXZIDE-25 Take 1 tablet by mouth daily.       Discharge Assessment: Vitals:   03/20/19 1945 03/21/19 0512  BP: 115/87 124/89  Pulse: 73 67  Resp: 18 18  Temp: 98.5 F (36.9 C) 98 F (36.7 C)  SpO2: 100% 100%   Skin clean, dry and intact without evidence of skin break down, no evidence of skin tears noted. IV catheter discontinued intact. Site without signs and symptoms of complications - no redness or edema noted at insertion site, patient denies c/o pain - only slight tenderness at site.  Dressing with slight pressure applied.  D/c Instructions-Education: Discharge instructions given to patient/family with verbalized understanding. D/c education  completed with patient/family including follow up instructions, medication list, d/c activities limitations if indicated, with other d/c instructions as indicated by MD - patient able to verbalize understanding, all questions fully answered. Patient instructed to return to ED, call 911, or call MD for any changes in condition.  Patient escorted via [place], and D/C home via private auto.  Eda Keys, RN 03/21/2019 8:47 AM

## 2019-03-23 LAB — SURGICAL PATHOLOGY

## 2020-06-25 ENCOUNTER — Other Ambulatory Visit: Payer: Self-pay | Admitting: Internal Medicine

## 2020-06-25 DIAGNOSIS — Z1231 Encounter for screening mammogram for malignant neoplasm of breast: Secondary | ICD-10-CM

## 2020-06-25 DIAGNOSIS — H539 Unspecified visual disturbance: Secondary | ICD-10-CM

## 2020-06-25 DIAGNOSIS — R270 Ataxia, unspecified: Secondary | ICD-10-CM

## 2020-06-25 DIAGNOSIS — R519 Headache, unspecified: Secondary | ICD-10-CM

## 2020-07-16 ENCOUNTER — Ambulatory Visit
Admission: RE | Admit: 2020-07-16 | Discharge: 2020-07-16 | Disposition: A | Discharge status: Home / Self Care | Payer: Medicare Other | Source: Ambulatory Visit | Attending: Internal Medicine | Admitting: Internal Medicine

## 2020-07-16 ENCOUNTER — Other Ambulatory Visit: Payer: Self-pay

## 2020-07-16 DIAGNOSIS — R519 Headache, unspecified: Secondary | ICD-10-CM | POA: Diagnosis present

## 2020-07-16 DIAGNOSIS — R270 Ataxia, unspecified: Secondary | ICD-10-CM | POA: Diagnosis present

## 2020-07-16 DIAGNOSIS — H539 Unspecified visual disturbance: Secondary | ICD-10-CM | POA: Diagnosis not present

## 2020-09-09 ENCOUNTER — Other Ambulatory Visit: Payer: Self-pay

## 2020-09-09 ENCOUNTER — Ambulatory Visit
Admission: RE | Admit: 2020-09-09 | Discharge: 2020-09-09 | Disposition: A | Discharge status: Home / Self Care | Payer: Medicare Other | Source: Ambulatory Visit | Attending: Internal Medicine | Admitting: Internal Medicine

## 2020-09-09 DIAGNOSIS — Z1231 Encounter for screening mammogram for malignant neoplasm of breast: Secondary | ICD-10-CM | POA: Insufficient documentation

## 2021-09-21 ENCOUNTER — Other Ambulatory Visit: Payer: Self-pay | Admitting: Internal Medicine

## 2021-09-21 DIAGNOSIS — Z1231 Encounter for screening mammogram for malignant neoplasm of breast: Secondary | ICD-10-CM

## 2021-09-23 ENCOUNTER — Other Ambulatory Visit: Payer: Self-pay

## 2021-09-23 ENCOUNTER — Ambulatory Visit
Admission: RE | Admit: 2021-09-23 | Discharge: 2021-09-23 | Disposition: A | Discharge status: Home / Self Care | Payer: Medicare Other | Source: Ambulatory Visit | Attending: Internal Medicine | Admitting: Internal Medicine

## 2021-09-23 DIAGNOSIS — Z1231 Encounter for screening mammogram for malignant neoplasm of breast: Secondary | ICD-10-CM | POA: Insufficient documentation

## 2021-12-07 ENCOUNTER — Other Ambulatory Visit: Payer: Self-pay | Admitting: Internal Medicine

## 2021-12-07 DIAGNOSIS — R519 Headache, unspecified: Secondary | ICD-10-CM

## 2021-12-22 ENCOUNTER — Other Ambulatory Visit: Payer: Self-pay

## 2021-12-22 ENCOUNTER — Ambulatory Visit
Admission: RE | Admit: 2021-12-22 | Discharge: 2021-12-22 | Disposition: A | Discharge status: Home / Self Care | Payer: Medicare Other | Source: Ambulatory Visit | Attending: Internal Medicine | Admitting: Internal Medicine

## 2021-12-22 DIAGNOSIS — R519 Headache, unspecified: Secondary | ICD-10-CM | POA: Diagnosis not present

## 2022-03-09 ENCOUNTER — Other Ambulatory Visit: Payer: Self-pay | Admitting: Otolaryngology

## 2022-03-09 DIAGNOSIS — R42 Dizziness and giddiness: Secondary | ICD-10-CM

## 2022-03-24 ENCOUNTER — Ambulatory Visit
Admission: RE | Admit: 2022-03-24 | Discharge: 2022-03-24 | Disposition: A | Discharge status: Home / Self Care | Payer: Medicare Other | Source: Ambulatory Visit | Attending: Otolaryngology | Admitting: Otolaryngology

## 2022-03-24 DIAGNOSIS — R42 Dizziness and giddiness: Secondary | ICD-10-CM | POA: Insufficient documentation

## 2022-10-24 ENCOUNTER — Other Ambulatory Visit: Payer: Self-pay

## 2022-10-24 DIAGNOSIS — Z1231 Encounter for screening mammogram for malignant neoplasm of breast: Secondary | ICD-10-CM

## 2022-12-30 ENCOUNTER — Encounter: Payer: Self-pay | Admitting: *Deleted

## 2023-01-02 ENCOUNTER — Ambulatory Visit: Payer: Medicare Other | Admitting: Anesthesiology

## 2023-01-02 ENCOUNTER — Encounter: Payer: Self-pay | Admitting: *Deleted

## 2023-01-02 ENCOUNTER — Ambulatory Visit
Admission: RE | Admit: 2023-01-02 | Discharge: 2023-01-02 | Disposition: A | Discharge status: Home / Self Care | Payer: Medicare Other | Source: Ambulatory Visit | Attending: Gastroenterology | Admitting: Gastroenterology

## 2023-01-02 ENCOUNTER — Encounter
Admission: RE | Disposition: A | Discharge status: Home / Self Care | Payer: Self-pay | Source: Ambulatory Visit | Attending: Gastroenterology

## 2023-01-02 DIAGNOSIS — K5730 Diverticulosis of large intestine without perforation or abscess without bleeding: Secondary | ICD-10-CM | POA: Diagnosis not present

## 2023-01-02 DIAGNOSIS — K640 First degree hemorrhoids: Secondary | ICD-10-CM | POA: Insufficient documentation

## 2023-01-02 DIAGNOSIS — I10 Essential (primary) hypertension: Secondary | ICD-10-CM | POA: Insufficient documentation

## 2023-01-02 DIAGNOSIS — K219 Gastro-esophageal reflux disease without esophagitis: Secondary | ICD-10-CM | POA: Insufficient documentation

## 2023-01-02 DIAGNOSIS — Z79899 Other long term (current) drug therapy: Secondary | ICD-10-CM | POA: Insufficient documentation

## 2023-01-02 DIAGNOSIS — F319 Bipolar disorder, unspecified: Secondary | ICD-10-CM | POA: Diagnosis not present

## 2023-01-02 DIAGNOSIS — G709 Myoneural disorder, unspecified: Secondary | ICD-10-CM | POA: Diagnosis not present

## 2023-01-02 DIAGNOSIS — Z1211 Encounter for screening for malignant neoplasm of colon: Secondary | ICD-10-CM | POA: Diagnosis not present

## 2023-01-02 DIAGNOSIS — Z86010 Personal history of colonic polyps: Secondary | ICD-10-CM | POA: Diagnosis present

## 2023-01-02 HISTORY — PX: COLONOSCOPY WITH PROPOFOL: SHX5780

## 2023-01-02 SURGERY — COLONOSCOPY WITH PROPOFOL
Anesthesia: General

## 2023-01-02 MED ORDER — PROPOFOL 10 MG/ML IV BOLUS
INTRAVENOUS | Status: DC | PRN
  Administered 2023-01-02 (×3): 50 mg via INTRAVENOUS

## 2023-01-02 MED ORDER — LIDOCAINE HCL (CARDIAC) PF 100 MG/5ML IV SOSY
PREFILLED_SYRINGE | INTRAVENOUS | Status: DC | PRN
  Administered 2023-01-02: 40 mg via INTRAVENOUS

## 2023-01-02 MED ORDER — SODIUM CHLORIDE 0.9 % IV SOLN: INTRAVENOUS | Status: DC

## 2023-01-02 MED ORDER — PROPOFOL 10 MG/ML IV BOLUS
INTRAVENOUS | Status: AC
  Filled 2023-01-02: qty 20

## 2023-01-02 MED ORDER — PROPOFOL 500 MG/50ML IV EMUL
INTRAVENOUS | Status: DC | PRN
  Administered 2023-01-02: 100 ug/kg/min via INTRAVENOUS

## 2023-01-02 NOTE — Transfer of Care (Signed)
Immediate Anesthesia Transfer of Care Note  Patient: Erica Stone  Procedure(s) Performed: COLONOSCOPY WITH PROPOFOL  Patient Location: PACU and Endoscopy Unit  Anesthesia Type:General  Level of Consciousness: drowsy and patient cooperative  Airway & Oxygen Therapy: Patient Spontanous Breathing and Patient connected to nasal cannula oxygen  Post-op Assessment: Report given to RN and Patient moving all extremities  Post vital signs: Reviewed and stable  Last Vitals:  Vitals Value Taken Time  BP    Temp    Pulse    Resp 22 01/02/23 1403  SpO2    Vitals shown include unvalidated device data.  Last Pain:  Vitals:   01/02/23 1325  TempSrc: Temporal  PainSc: 0-No pain         Complications: No notable events documented.

## 2023-01-02 NOTE — H&P (Signed)
Outpatient short stay form Pre-procedure 01/02/2023  Erica Rubenstein, MD  Primary Physician: Idelle Crouch, MD  Reason for visit:  Surveillance colonoscopy  History of present illness:    62 y/o lady with history of arthritis, hypertension, and bipolar here for colonoscopy for history of polyps. Last colonoscopy in 2018 with small TA. No blood thinners. History of partial hysterectomy. No first degree relative with GI malignancies.    Current Facility-Administered Medications:    0.9 %  sodium chloride infusion, , Intravenous, Continuous, Erica Stone, Erica Cork, MD  Medications Prior to Admission  Medication Sig Dispense Refill Last Dose   Calcium Citrate-Vitamin D (CITRACAL + D PO) Take 1 tablet by mouth daily.   Past Week   Cholecalciferol (D 2000) 50 MCG (2000 UT) TABS Take 200 Units by mouth daily. Reported on 12/24/2015   Past Week   gabapentin (NEURONTIN) 600 MG tablet Take 600 mg by mouth 3 (three) times daily.    01/01/2023   Multiple Vitamins-Calcium (ONE-A-DAY WOMENS FORMULA PO) Take 1 tablet by mouth daily.   Past Week   triamterene-hydrochlorothiazide (MAXZIDE-25) 37.5-25 MG per tablet Take 1 tablet by mouth daily.    01/02/2023 at 0700   meloxicam (MOBIC) 7.5 MG tablet Take 7.5 mg by mouth daily.       omeprazole (PRILOSEC) 40 MG capsule Take 40 mg by mouth daily.         Allergies  Allergen Reactions   Gadolinium Derivatives     Other reaction(s): Vomiting   Other     Other reaction(s): Other (See Comments) Gotalonium IV dye- hot with vomiting     Past Medical History:  Diagnosis Date   Bipolar disorder ([place])    DDD (degenerative disc disease), cervical    Depression    GERD (gastroesophageal reflux disease)    Hypertension    Lobular carcinoma of breast ([place]) 2006   lumpectomy- Rt, Chemo + rad tx's.   Neuropathy    Personal history of chemotherapy    Personal history of radiation therapy 2006/2007   RIGHT lumpectomy   Renal mass    Urinary frequency      Review of systems:  Otherwise negative.    Physical Exam  Gen: Alert, oriented. Appears stated age.  HEENT: PERRLA. Lungs: No respiratory distress CV: RRR Abd: soft, benign, no masses Ext: No edema    Planned procedures: Proceed with colonoscopy. The patient understands the nature of the planned procedure, indications, risks, alternatives and potential complications including but not limited to bleeding, infection, perforation, damage to internal organs and possible oversedation/side effects from anesthesia. The patient agrees and gives consent to proceed.  Please refer to procedure notes for findings, recommendations and patient disposition/instructions.     Erica Rubenstein, MD [facility]

## 2023-01-02 NOTE — Anesthesia Postprocedure Evaluation (Signed)
Anesthesia Post Note  Patient: Erica Stone  Procedure(s) Performed: COLONOSCOPY WITH PROPOFOL  Patient location during evaluation: PACU Anesthesia Type: General Level of consciousness: awake and alert Pain management: pain level controlled Vital Signs Assessment: post-procedure vital signs reviewed and stable Respiratory status: spontaneous breathing, nonlabored ventilation, respiratory function stable and patient connected to nasal cannula oxygen Cardiovascular status: stable and blood pressure returned to baseline Postop Assessment: no apparent nausea or vomiting Anesthetic complications: no   No notable events documented.   Last Vitals:  Vitals:   01/02/23 1325 01/02/23 1403  BP: (!) 146/96 133/84  Pulse: 81 87  Resp: 18 (!) 22  Temp: (!) 36 C (!) 35.7 C  SpO2: 100% 100%    Last Pain:  Vitals:   01/02/23 1403  TempSrc: Temporal  PainSc: 0-No pain                 Ilene Qua

## 2023-01-02 NOTE — Op Note (Signed)
[facility] Patient Name: Erica Stone Procedure Date: 01/02/2023 1:10 PM MRN: [id number] Account #: [id number] Date of Birth: 11/20/1960 Admit Type: Outpatient Age: 62 Room: [facility] ENDO ROOM 3 Gender: Female Note Status: Finalized Instrument Name: Jasper Riling [id number] Procedure:             Colonoscopy Indications:           Surveillance: Personal history of adenomatous polyps                         on last colonoscopy > 5 years ago Providers:             Andrey Farmer MD, MD Referring MD:          Leonie Douglas. Doy Hutching, MD (Referring MD) Medicines:             Monitored Anesthesia Care Complications:         No immediate complications. Procedure:             Pre-Anesthesia Assessment:                        - Prior to the procedure, a History and Physical was                         performed, and patient medications and allergies were                         reviewed. The patient is competent. The risks and                         benefits of the procedure and the sedation options and                         risks were discussed with the patient. All questions                         were answered and informed consent was obtained.                         Patient identification and proposed procedure were                         verified by the physician, the nurse, the                         anesthesiologist, the anesthetist and the technician                         in the endoscopy suite. Mental Status Examination:                         alert and oriented. Airway Examination: normal                         oropharyngeal airway and neck mobility. Respiratory                         Examination: clear to auscultation. CV Examination:  normal. Prophylactic Antibiotics: The patient does not                         require prophylactic antibiotics. Prior                         Anticoagulants: The patient has taken no  anticoagulant                         or antiplatelet agents. ASA Grade Assessment: II - A                         patient with mild systemic disease. After reviewing                         the risks and benefits, the patient was deemed in                         satisfactory condition to undergo the procedure. The                         anesthesia plan was to use monitored anesthesia care                         (MAC). Immediately prior to administration of                         medications, the patient was re-assessed for adequacy                         to receive sedatives. The heart rate, respiratory                         rate, oxygen saturations, blood pressure, adequacy of                         pulmonary ventilation, and response to care were                         monitored throughout the procedure. The physical                         status of the patient was re-assessed after the                         procedure.                        After obtaining informed consent, the colonoscope was                         passed under direct vision. Throughout the procedure,                         the patient's blood pressure, pulse, and oxygen                         saturations were monitored continuously. The  Colonoscope was introduced through the anus and                         advanced to the the cecum, identified by appendiceal                         orifice and ileocecal valve. The colonoscopy was                         somewhat difficult due to a redundant colon. The                         patient tolerated the procedure well. The quality of                         the bowel preparation was inadequate. The ileocecal                         valve, appendiceal orifice, and rectum were                         photographed. Findings:      The perianal and digital rectal examinations were normal.      A few small-mouthed diverticula were found in  the sigmoid colon and       ascending colon.      Internal hemorrhoids were found during retroflexion. The hemorrhoids       were Grade I (internal hemorrhoids that do not prolapse).      The exam was otherwise without abnormality on direct and retroflexion       views. Impression:            - Preparation of the colon was inadequate.                        - Diverticulosis in the sigmoid colon and in the                         ascending colon.                        - Internal hemorrhoids.                        - The examination was otherwise normal on direct and                         retroflexion views.                        - No specimens collected. Recommendation:        - Discharge patient to home.                        - Resume previous diet.                        - Continue present medications.                        - Repeat colonoscopy in 6 months because the bowel  preparation was suboptimal.                        - Return to referring physician as previously                         scheduled. Procedure Code(s):     --- Professional ---                        [id number], Colorectal cancer screening; colonoscopy on                         individual at high risk Diagnosis Code(s):     --- Professional ---                        Z86.010, Personal history of colonic polyps                        K64.0, First degree hemorrhoids                        K57.30, Diverticulosis of large intestine without                         perforation or abscess without bleeding CPT copyright 2022 American Medical Association. All rights reserved. The codes documented in this report are preliminary and upon coder review may  be revised to meet current compliance requirements. Andrey Farmer MD, MD 01/02/2023 2:02:38 PM Number of Addenda: 0 Note Initiated On: 01/02/2023 1:10 PM Scope Withdrawal Time: 0 hours 3 minutes 59 seconds  Total Procedure Duration: 0 hours 11  minutes 24 seconds  Estimated Blood Loss:  Estimated blood loss: none.      [facility]

## 2023-01-02 NOTE — Interval H&P Note (Signed)
History and Physical Interval Note:  01/02/2023 1:38 PM  Erica Stone  has presented today for surgery, with the diagnosis of personal history adenomatous polyp.  The various methods of treatment have been discussed with the patient and family. After consideration of risks, benefits and other options for treatment, the patient has consented to  Procedure(s): COLONOSCOPY WITH PROPOFOL (N/A) as a surgical intervention.  The patient's history has been reviewed, patient examined, no change in status, stable for surgery.  I have reviewed the patient's chart and labs.  Questions were answered to the patient's satisfaction.     Lesly Rubenstein  Ok to proceed with colonoscopy

## 2023-01-02 NOTE — Anesthesia Preprocedure Evaluation (Addendum)
Anesthesia Evaluation  Patient identified by MRN, date of birth, ID band Patient awake    Reviewed: Allergy & Precautions, NPO status , Patient's Chart, lab work & pertinent test results  History of Anesthesia Complications Negative for: history of anesthetic complications  Airway Mallampati: III  TM Distance: >3 FB Neck ROM: full    Dental  (+) Partial Upper   Pulmonary Current Smoker and Patient abstained from smoking.   Pulmonary exam normal        Cardiovascular hypertension, On Medications Normal cardiovascular exam     Neuro/Psych  PSYCHIATRIC DISORDERS  Depression Bipolar Disorder    Neuromuscular disease    GI/Hepatic Neg liver ROS,GERD  Medicated,,  Endo/Other  negative endocrine ROS    Renal/GU negative Renal ROS  negative genitourinary   Musculoskeletal   Abdominal   Peds  Hematology negative hematology ROS (+)   Anesthesia Other Findings Past Medical History: No date: Bipolar disorder ([place]) No date: DDD (degenerative disc disease), cervical No date: Depression No date: GERD (gastroesophageal reflux disease) No date: Hypertension 2006: Lobular carcinoma of breast ([place])     Comment:  lumpectomy- Rt, Chemo + rad tx's. No date: Neuropathy No date: Personal history of chemotherapy 2006/2007: Personal history of radiation therapy     Comment:  RIGHT lumpectomy No date: Renal mass No date: Urinary frequency  Past Surgical History: 1992: ABDOMINAL HYSTERECTOMY     Comment:  fibroid- PJR 2006: BREAST BIOPSY; Right     Comment:  + 2006: BREAST LUMPECTOMY; Right     Comment:  lumpectomy w/ radiation and chemo 2011: CERVICAL SPINE SURGERY 07/28/2016: GANGLION CYST EXCISION; Left     Comment:  Procedure: REMOVAL GANGLION OF WRIST;  Surgeon: Hessie Knows, MD;  Location: [facility] ORS;  Service: Orthopedics;                Laterality: Left; 03/20/2019: PAROTIDECTOMY; Left     Comment:   Procedure: PAROTIDECTOMY LEFT SUPERFICIAL;  Surgeon:               Margaretha Sheffield, MD;  Location: [facility] ORS;  Service: ENT;                Laterality: Left; No date: PARTIAL HYSTERECTOMY No date: TOE SURGERY; Left     Comment:  4th & 5th No date: WRIST SURGERY; Left     Comment:  ganglion with reoccurence     Reproductive/Obstetrics negative OB ROS                             Anesthesia Physical Anesthesia Plan  ASA: 2  Anesthesia Plan: General   Post-op Pain Management: Minimal or no pain anticipated   Induction: Intravenous  PONV Risk Score and Plan: Propofol infusion and TIVA  Airway Management Planned: Natural Airway and Nasal Cannula  Additional Equipment:   Intra-op Plan:   Post-operative Plan:   Informed Consent: I have reviewed the patients History and Physical, chart, labs and discussed the procedure including the risks, benefits and alternatives for the proposed anesthesia with the patient or authorized representative who has indicated his/her understanding and acceptance.     Dental Advisory Given  Plan Discussed with: Anesthesiologist, CRNA and Surgeon  Anesthesia Plan Comments: (Patient consented for risks of anesthesia including but not limited to:  - adverse reactions to medications - risk of airway placement if required - damage  to eyes, teeth, lips or other oral mucosa - nerve damage due to positioning  - sore throat or hoarseness - Damage to heart, brain, nerves, lungs, other parts of body or loss of life  Patient voiced understanding.)        Anesthesia Quick Evaluation

## 2023-01-03 ENCOUNTER — Encounter: Payer: Self-pay | Admitting: Gastroenterology

## 2023-04-25 ENCOUNTER — Other Ambulatory Visit: Payer: Self-pay | Admitting: Internal Medicine

## 2023-04-25 DIAGNOSIS — Z1231 Encounter for screening mammogram for malignant neoplasm of breast: Secondary | ICD-10-CM

## 2023-06-26 ENCOUNTER — Ambulatory Visit
Admission: RE | Admit: 2023-06-26 | Discharge: 2023-06-26 | Disposition: A | Discharge status: Home / Self Care | Payer: Medicare Other | Attending: Gastroenterology | Admitting: Gastroenterology

## 2023-06-26 ENCOUNTER — Ambulatory Visit: Payer: Medicare Other | Admitting: Certified Registered"

## 2023-06-26 ENCOUNTER — Encounter
Admission: RE | Disposition: A | Discharge status: Home / Self Care | Payer: Self-pay | Source: Home / Self Care | Attending: Gastroenterology

## 2023-06-26 DIAGNOSIS — K640 First degree hemorrhoids: Secondary | ICD-10-CM | POA: Insufficient documentation

## 2023-06-26 DIAGNOSIS — Z86010 Personal history of colonic polyps: Secondary | ICD-10-CM | POA: Insufficient documentation

## 2023-06-26 DIAGNOSIS — K219 Gastro-esophageal reflux disease without esophagitis: Secondary | ICD-10-CM | POA: Diagnosis not present

## 2023-06-26 DIAGNOSIS — I10 Essential (primary) hypertension: Secondary | ICD-10-CM | POA: Insufficient documentation

## 2023-06-26 DIAGNOSIS — G629 Polyneuropathy, unspecified: Secondary | ICD-10-CM | POA: Insufficient documentation

## 2023-06-26 DIAGNOSIS — F17210 Nicotine dependence, cigarettes, uncomplicated: Secondary | ICD-10-CM | POA: Insufficient documentation

## 2023-06-26 DIAGNOSIS — K5730 Diverticulosis of large intestine without perforation or abscess without bleeding: Secondary | ICD-10-CM | POA: Diagnosis not present

## 2023-06-26 DIAGNOSIS — Z9221 Personal history of antineoplastic chemotherapy: Secondary | ICD-10-CM | POA: Diagnosis not present

## 2023-06-26 DIAGNOSIS — Z853 Personal history of malignant neoplasm of breast: Secondary | ICD-10-CM | POA: Diagnosis not present

## 2023-06-26 DIAGNOSIS — Z79899 Other long term (current) drug therapy: Secondary | ICD-10-CM | POA: Diagnosis not present

## 2023-06-26 DIAGNOSIS — Z923 Personal history of irradiation: Secondary | ICD-10-CM | POA: Insufficient documentation

## 2023-06-26 DIAGNOSIS — Z1211 Encounter for screening for malignant neoplasm of colon: Secondary | ICD-10-CM | POA: Diagnosis present

## 2023-06-26 DIAGNOSIS — Q438 Other specified congenital malformations of intestine: Secondary | ICD-10-CM | POA: Diagnosis not present

## 2023-06-26 HISTORY — PX: COLONOSCOPY WITH PROPOFOL: SHX5780

## 2023-06-26 SURGERY — COLONOSCOPY WITH PROPOFOL
Anesthesia: General

## 2023-06-26 MED ORDER — PROPOFOL 10 MG/ML IV BOLUS
INTRAVENOUS | Status: DC | PRN
  Administered 2023-06-26: 100 mg via INTRAVENOUS
  Administered 2023-06-26: 170 ug/kg/min via INTRAVENOUS

## 2023-06-26 MED ORDER — LIDOCAINE HCL (PF) 2 % IJ SOLN
INTRAMUSCULAR | Status: AC
  Filled 2023-06-26: qty 5

## 2023-06-26 MED ORDER — LIDOCAINE HCL (CARDIAC) PF 100 MG/5ML IV SOSY
PREFILLED_SYRINGE | INTRAVENOUS | Status: DC | PRN
  Administered 2023-06-26: 100 mg via INTRAVENOUS

## 2023-06-26 MED ORDER — SODIUM CHLORIDE 0.9 % IV SOLN
INTRAVENOUS | Status: DC
  Administered 2023-06-26: 20 mL/h via INTRAVENOUS

## 2023-06-26 MED ORDER — PROPOFOL 10 MG/ML IV BOLUS
INTRAVENOUS | Status: AC
  Filled 2023-06-26: qty 40

## 2023-06-26 NOTE — Op Note (Signed)
[facility] Patient Name: Erica Stone Procedure Date: 06/26/2023 9:05 AM MRN: [id number] Account #: [id number] Date of Birth: 1961-08-18 Admit Type: Outpatient Age: 62 Room: [facility] ENDO ROOM 3 Gender: Female Note Status: Finalized Instrument Name: Prentice Docker [id number] Procedure:             Colonoscopy Indications:           Surveillance: Personal history of adenomatous polyps,                         inadequate prep on last colonoscopy (less than 1 year                         ago) Providers:             Eather Colas MD, MD Referring MD:          Duane Lope. Judithann Sheen, MD (Referring MD) Medicines:             Monitored Anesthesia Care Complications:         No immediate complications. Procedure:             Pre-Anesthesia Assessment:                        - Prior to the procedure, a History and Physical was                         performed, and patient medications and allergies were                         reviewed. The patient is competent. The risks and                         benefits of the procedure and the sedation options and                         risks were discussed with the patient. All questions                         were answered and informed consent was obtained.                         Patient identification and proposed procedure were                         verified by the physician, the nurse, the                         anesthesiologist, the anesthetist and the technician                         in the endoscopy suite. Mental Status Examination:                         alert and oriented. Airway Examination: normal                         oropharyngeal airway and neck mobility. Respiratory  Examination: clear to auscultation. CV Examination:                         normal. Prophylactic Antibiotics: The patient does not                         require prophylactic antibiotics. Prior                          Anticoagulants: The patient has taken no anticoagulant                         or antiplatelet agents. ASA Grade Assessment: III - A                         patient with severe systemic disease. After reviewing                         the risks and benefits, the patient was deemed in                         satisfactory condition to undergo the procedure. The                         anesthesia plan was to use monitored anesthesia care                         (MAC). Immediately prior to administration of                         medications, the patient was re-assessed for adequacy                         to receive sedatives. The heart rate, respiratory                         rate, oxygen saturations, blood pressure, adequacy of                         pulmonary ventilation, and response to care were                         monitored throughout the procedure. The physical                         status of the patient was re-assessed after the                         procedure.                        After obtaining informed consent, the colonoscope was                         passed under direct vision. Throughout the procedure,                         the patient's blood pressure, pulse, and oxygen  saturations were monitored continuously. The                         Colonoscope was introduced through the anus and                         advanced to the the cecum, identified by appendiceal                         orifice and ileocecal valve. The colonoscopy was                         somewhat difficult due to a redundant colon.                         Successful completion of the procedure was aided by                         applying abdominal pressure. The patient tolerated the                         procedure well. The quality of the bowel preparation                         was adequate to identify polyps. The ileocecal valve,                          appendiceal orifice, and rectum were photographed. Findings:      The perianal and digital rectal examinations were normal.      Multiple small-mouthed diverticula were found in the sigmoid colon and       hepatic flexure.      Internal hemorrhoids were found during retroflexion. The hemorrhoids       were Grade I (internal hemorrhoids that do not prolapse).      The exam was otherwise without abnormality on direct and retroflexion       views. Impression:            - Diverticulosis in the sigmoid colon and at the                         hepatic flexure.                        - Internal hemorrhoids.                        - The examination was otherwise normal on direct and                         retroflexion views.                        - No specimens collected. Recommendation:        - Discharge patient to home.                        - Resume previous diet.                        - Continue present medications.                        -  Repeat colonoscopy in 5 years for surveillance.                        - Return to referring physician as previously                         scheduled. Procedure Code(s):     --- Professional ---                        N5621, Colorectal cancer screening; colonoscopy on                         individual at high risk Diagnosis Code(s):     --- Professional ---                        Z86.010, Personal history of colonic polyps                        K64.0, First degree hemorrhoids                        K57.30, Diverticulosis of large intestine without                         perforation or abscess without bleeding CPT copyright 2022 [employer]. All rights reserved. The codes documented in this report are preliminary and upon coder review may  be revised to meet current compliance requirements. Eather Colas MD, MD 06/26/2023 9:42:01 AM Number of Addenda: 0 Note Initiated On: 06/26/2023 9:05 AM Scope Withdrawal Time: 0 hours 7  minutes 42 seconds  Total Procedure Duration: 0 hours 13 minutes 18 seconds  Estimated Blood Loss:  Estimated blood loss: none.      [facility]

## 2023-06-26 NOTE — H&P (Signed)
Outpatient short stay form Pre-procedure 06/26/2023  Erica Bill, MD  Primary Physician: Marguarite Arbour, MD  Reason for visit:  Surveillance colonoscopy  History of present illness:    62 y/o lady with history of arthritis, hypertension, and bipolar here for colonoscopy for history of TA on last colonoscopy in 2018. Last colonoscopy 6 months ago with poor prep. No blood thinners. History of partial hysterectomy. No first degree relatives with GI malignancies.    Current Facility-Administered Medications:    0.9 %  sodium chloride infusion, , Intravenous, Continuous, Supreme Rybarczyk, Rossie Muskrat, MD, Last Rate: 20 mL/hr at 06/26/23 0858, 20 mL/hr at 06/26/23 0858  Medications Prior to Admission  Medication Sig Dispense Refill Last Dose   Calcium Citrate-Vitamin D (CITRACAL + D PO) Take 1 tablet by mouth daily.   06/25/2023   Cholecalciferol (D 2000) 50 MCG (2000 UT) TABS Take 200 Units by mouth daily. Reported on 12/24/2015   06/25/2023   gabapentin (NEURONTIN) 600 MG tablet Take 600 mg by mouth 3 (three) times daily.    06/25/2023   meloxicam (MOBIC) 7.5 MG tablet Take 7.5 mg by mouth daily.    06/25/2023   Multiple Vitamins-Calcium (ONE-A-DAY WOMENS FORMULA PO) Take 1 tablet by mouth daily.   06/25/2023   omeprazole (PRILOSEC) 40 MG capsule Take 40 mg by mouth daily.    06/25/2023   triamterene-hydrochlorothiazide (MAXZIDE-25) 37.5-25 MG per tablet Take 1 tablet by mouth daily.    06/26/2023 at 0630     Allergies  Allergen Reactions   Gadolinium Derivatives     Other reaction(s): Vomiting   Other     Other reaction(s): Other (See Comments) Gotalonium IV dye- hot with vomiting     Past Medical History:  Diagnosis Date   Bipolar disorder ([facility])    DDD (degenerative disc disease), cervical    Depression    GERD (gastroesophageal reflux disease)    Hypertension    Lobular carcinoma of breast ([facility]) 2006   lumpectomy- Rt, Chemo + rad tx's.   Neuropathy    Personal history of chemotherapy     Personal history of radiation therapy 2006/2007   RIGHT lumpectomy   Renal mass    Urinary frequency     Review of systems:  Otherwise negative.    Physical Exam  Gen: Alert, oriented. Appears stated age.  HEENT: PERRLA. Lungs: No respiratory distress CV: RRR Abd: soft, benign, no masses Ext: No edema    Planned procedures: Proceed with colonoscopy. The patient understands the nature of the planned procedure, indications, risks, alternatives and potential complications including but not limited to bleeding, infection, perforation, damage to internal organs and possible oversedation/side effects from anesthesia. The patient agrees and gives consent to proceed.  Please refer to procedure notes for findings, recommendations and patient disposition/instructions.     Erica Bill, MD [facility]

## 2023-06-26 NOTE — Interval H&P Note (Signed)
History and Physical Interval Note:  06/26/2023 9:16 AM  Erica Stone  has presented today for surgery, with the diagnosis of PH Colon Polyps.  The various methods of treatment have been discussed with the patient and family. After consideration of risks, benefits and other options for treatment, the patient has consented to  Procedure(s): COLONOSCOPY WITH PROPOFOL (N/A) as a surgical intervention.  The patient's history has been reviewed, patient examined, no change in status, stable for surgery.  I have reviewed the patient's chart and labs.  Questions were answered to the patient's satisfaction.     Regis Bill  Ok to proceed with colonoscopy

## 2023-06-26 NOTE — Anesthesia Preprocedure Evaluation (Signed)
Anesthesia Evaluation  Patient identified by MRN, date of birth, ID band Patient awake    Reviewed: Allergy & Precautions, H&P , NPO status , Patient's Chart, lab work & pertinent test results, reviewed documented beta blocker date and time   Airway Mallampati: II   Neck ROM: full    Dental  (+) Poor Dentition   Pulmonary neg pulmonary ROS, Current Smoker and Patient abstained from smoking.   Pulmonary exam normal        Cardiovascular hypertension, On Medications negative cardio ROS Normal cardiovascular exam Rhythm:regular Rate:Normal     Neuro/Psych  PSYCHIATRIC DISORDERS  Depression Bipolar Disorder   negative neurological ROS     GI/Hepatic Neg liver ROS,GERD  Medicated,,  Endo/Other  negative endocrine ROS    Renal/GU negative Renal ROS  negative genitourinary   Musculoskeletal   Abdominal   Peds  Hematology negative hematology ROS (+)   Anesthesia Other Findings Past Medical History: No date: Bipolar disorder ([facility]) No date: DDD (degenerative disc disease), cervical No date: Depression No date: GERD (gastroesophageal reflux disease) No date: Hypertension 2006: Lobular carcinoma of breast ([facility])     Comment:  lumpectomy- Rt, Chemo + rad tx's. No date: Neuropathy No date: Personal history of chemotherapy 2006/2007: Personal history of radiation therapy     Comment:  RIGHT lumpectomy No date: Renal mass No date: Urinary frequency Past Surgical History: 1992: ABDOMINAL HYSTERECTOMY     Comment:  fibroid- PJR 2006: BREAST BIOPSY; Right     Comment:  + 2006: BREAST LUMPECTOMY; Right     Comment:  lumpectomy w/ radiation and chemo 2011: CERVICAL SPINE SURGERY No date: COLONOSCOPY 01/02/2023: COLONOSCOPY WITH PROPOFOL; N/A     Comment:  Procedure: COLONOSCOPY WITH PROPOFOL;  Surgeon:               Regis Bill, MD;  Location: [facility] END[facility];                Service: Endoscopy;  Laterality:  N/A; 07/28/2016: GANGLION CYST EXCISION; Left     Comment:  Procedure: REMOVAL GANGLION OF WRIST;  Surgeon: Kennedy Bucker, MD;  Location: [facility] ORS;  Service: Orthopedics;                Laterality: Left; 03/20/2019: PAROTIDECTOMY; Left     Comment:  Procedure: PAROTIDECTOMY LEFT SUPERFICIAL;  Surgeon:               Vernie Murders, MD;  Location: [facility] ORS;  Service: ENT;                Laterality: Left; No date: PARTIAL HYSTERECTOMY No date: TOE SURGERY; Left     Comment:  4th & 5th No date: WRIST SURGERY; Left     Comment:  ganglion with reoccurence BMI    Body Mass Index: 22.95 kg/m     Reproductive/Obstetrics negative OB ROS                             Anesthesia Physical Anesthesia Plan  ASA: 3  Anesthesia Plan: General   Post-op Pain Management:    Induction:   PONV Risk Score and Plan:   Airway Management Planned:   Additional Equipment:   Intra-op Plan:   Post-operative Plan:   Informed Consent: I have reviewed the patients History and Physical, chart, labs and discussed the procedure including the risks, benefits  and alternatives for the proposed anesthesia with the patient or authorized representative who has indicated his/her understanding and acceptance.     Dental Advisory Given  Plan Discussed with: CRNA  Anesthesia Plan Comments:        Anesthesia Quick Evaluation

## 2023-06-26 NOTE — Group Note (Deleted)

## 2023-06-26 NOTE — Transfer of Care (Signed)
Immediate Anesthesia Transfer of Care Note  Patient: Erica Stone  Procedure(s) Performed: COLONOSCOPY WITH PROPOFOL  Patient Location: Endoscopy Unit  Anesthesia Type:General  Level of Consciousness: drowsy  Airway & Oxygen Therapy: Patient Spontanous Breathing  Post-op Assessment: Report given to RN and Post -op Vital signs reviewed and stable  Post vital signs: Reviewed and stable  Last Vitals:  Vitals Value Taken Time  BP 134/73 06/26/23 0943  Temp 35.9 0943  Pulse 71 06/26/23 0943  Resp 21 06/26/23 0943  SpO2 100 % 06/26/23 0943    Last Pain:  Vitals:   06/26/23 0847  TempSrc: Temporal  PainSc: 0-No pain         Complications: No notable events documented.

## 2023-06-27 ENCOUNTER — Encounter: Payer: Self-pay | Admitting: Gastroenterology

## 2023-06-27 NOTE — Anesthesia Postprocedure Evaluation (Signed)
Anesthesia Post Note  Patient: Erica Stone  Procedure(s) Performed: COLONOSCOPY WITH PROPOFOL  Patient location during evaluation: PACU Anesthesia Type: General Level of consciousness: awake and alert Pain management: pain level controlled Vital Signs Assessment: post-procedure vital signs reviewed and stable Respiratory status: spontaneous breathing, nonlabored ventilation, respiratory function stable and patient connected to nasal cannula oxygen Cardiovascular status: blood pressure returned to baseline and stable Postop Assessment: no apparent nausea or vomiting Anesthetic complications: no   No notable events documented.   Last Vitals:  Vitals:   06/26/23 1000 06/26/23 1002  BP:  (!) 165/88  Pulse:  62  Resp: 17 12  Temp:    SpO2:      Last Pain:  Vitals:   06/27/23 0740  TempSrc:   PainSc: 0-No pain                 Yevette Edwards

## 2023-11-13 ENCOUNTER — Encounter: Payer: Self-pay | Admitting: Internal Medicine

## 2023-11-14 ENCOUNTER — Other Ambulatory Visit: Payer: Self-pay | Admitting: Internal Medicine

## 2023-11-14 DIAGNOSIS — N63 Unspecified lump in unspecified breast: Secondary | ICD-10-CM

## 2023-11-29 ENCOUNTER — Ambulatory Visit
Admission: RE | Admit: 2023-11-29 | Discharge: 2023-11-29 | Disposition: A | Discharge status: Home / Self Care | Payer: Medicare Other | Source: Ambulatory Visit | Attending: Internal Medicine | Admitting: Internal Medicine

## 2023-11-29 DIAGNOSIS — Z9889 Other specified postprocedural states: Secondary | ICD-10-CM | POA: Diagnosis not present

## 2023-11-29 DIAGNOSIS — N6310 Unspecified lump in the right breast, unspecified quadrant: Secondary | ICD-10-CM | POA: Diagnosis not present

## 2023-11-29 DIAGNOSIS — Z853 Personal history of malignant neoplasm of breast: Secondary | ICD-10-CM | POA: Insufficient documentation

## 2023-11-29 DIAGNOSIS — N63 Unspecified lump in unspecified breast: Secondary | ICD-10-CM | POA: Diagnosis present

## 2023-11-29 DIAGNOSIS — Z923 Personal history of irradiation: Secondary | ICD-10-CM | POA: Insufficient documentation
